# Patient Record
Sex: Female | Born: 1950 | Race: Black or African American | Hispanic: No | Smoking: Never smoker
Health system: Southern US, Community
[De-identification: ages and names within clinical notes are randomized; demographics above are authoritative.]

## PROBLEM LIST (undated history)

## (undated) DIAGNOSIS — A64 Unspecified sexually transmitted disease: Secondary | ICD-10-CM

## (undated) DIAGNOSIS — K8020 Calculus of gallbladder without cholecystitis without obstruction: Secondary | ICD-10-CM

## (undated) DIAGNOSIS — E7800 Pure hypercholesterolemia, unspecified: Secondary | ICD-10-CM

## (undated) DIAGNOSIS — I10 Essential (primary) hypertension: Secondary | ICD-10-CM

## (undated) HISTORY — PX: CHOLECYSTECTOMY: SHX55

## (undated) HISTORY — DX: Pure hypercholesterolemia, unspecified: E78.00

## (undated) HISTORY — DX: Unspecified sexually transmitted disease: A64

## (undated) HISTORY — DX: Calculus of gallbladder without cholecystitis without obstruction: K80.20

## (undated) HISTORY — PX: TONSILLECTOMY: SUR1361

## (undated) HISTORY — DX: Essential (primary) hypertension: I10

---

## 1987-07-09 HISTORY — PX: ABDOMINAL HYSTERECTOMY: SHX81

## 1989-07-08 HISTORY — PX: OOPHORECTOMY: SHX86

## 1998-01-10 ENCOUNTER — Encounter: Admission: RE | Admit: 1998-01-10 | Discharge: 1998-01-10 | Payer: Self-pay | Admitting: *Deleted

## 1998-10-15 ENCOUNTER — Encounter: Payer: Self-pay | Admitting: Emergency Medicine

## 1998-10-15 ENCOUNTER — Emergency Department (HOSPITAL_COMMUNITY): Admission: EM | Admit: 1998-10-15 | Discharge: 1998-10-15 | Payer: Self-pay | Admitting: Emergency Medicine

## 1999-04-10 ENCOUNTER — Other Ambulatory Visit: Admission: RE | Admit: 1999-04-10 | Discharge: 1999-04-10 | Payer: Self-pay | Admitting: Obstetrics and Gynecology

## 2000-01-09 ENCOUNTER — Emergency Department (HOSPITAL_COMMUNITY): Admission: EM | Admit: 2000-01-09 | Discharge: 2000-01-09 | Payer: Self-pay | Admitting: Emergency Medicine

## 2000-11-06 ENCOUNTER — Encounter: Payer: Self-pay | Admitting: Gastroenterology

## 2000-11-06 ENCOUNTER — Encounter: Admission: RE | Admit: 2000-11-06 | Discharge: 2000-11-06 | Payer: Self-pay | Admitting: Gastroenterology

## 2000-11-21 ENCOUNTER — Ambulatory Visit (HOSPITAL_COMMUNITY): Admission: RE | Admit: 2000-11-21 | Discharge: 2000-11-21 | Payer: Self-pay | Admitting: Gastroenterology

## 2000-11-23 ENCOUNTER — Emergency Department (HOSPITAL_COMMUNITY): Admission: EM | Admit: 2000-11-23 | Discharge: 2000-11-23 | Payer: Self-pay | Admitting: Emergency Medicine

## 2002-08-31 ENCOUNTER — Other Ambulatory Visit: Admission: RE | Admit: 2002-08-31 | Discharge: 2002-08-31 | Payer: Self-pay | Admitting: Obstetrics and Gynecology

## 2003-01-11 ENCOUNTER — Emergency Department (HOSPITAL_COMMUNITY): Admission: EM | Admit: 2003-01-11 | Discharge: 2003-01-11 | Payer: Self-pay | Admitting: Emergency Medicine

## 2003-09-09 ENCOUNTER — Ambulatory Visit (HOSPITAL_COMMUNITY): Admission: RE | Admit: 2003-09-09 | Discharge: 2003-09-10 | Payer: Self-pay | Admitting: General Surgery

## 2003-09-09 ENCOUNTER — Encounter (INDEPENDENT_AMBULATORY_CARE_PROVIDER_SITE_OTHER): Payer: Self-pay | Admitting: Specialist

## 2003-12-22 ENCOUNTER — Observation Stay (HOSPITAL_COMMUNITY): Admission: EM | Admit: 2003-12-22 | Discharge: 2003-12-23 | Payer: Self-pay | Admitting: Emergency Medicine

## 2004-01-16 ENCOUNTER — Encounter: Admission: RE | Admit: 2004-01-16 | Discharge: 2004-01-16 | Payer: Self-pay | Admitting: Gastroenterology

## 2004-02-09 ENCOUNTER — Other Ambulatory Visit: Admission: RE | Admit: 2004-02-09 | Discharge: 2004-02-09 | Payer: Self-pay | Admitting: Obstetrics and Gynecology

## 2005-02-11 ENCOUNTER — Other Ambulatory Visit: Admission: RE | Admit: 2005-02-11 | Discharge: 2005-02-11 | Payer: Self-pay | Admitting: Obstetrics and Gynecology

## 2005-06-13 ENCOUNTER — Encounter: Admission: RE | Admit: 2005-06-13 | Discharge: 2005-06-13 | Payer: Self-pay | Admitting: Gastroenterology

## 2006-02-13 ENCOUNTER — Other Ambulatory Visit: Admission: RE | Admit: 2006-02-13 | Discharge: 2006-02-13 | Payer: Self-pay | Admitting: Obstetrics and Gynecology

## 2007-02-16 ENCOUNTER — Other Ambulatory Visit: Admission: RE | Admit: 2007-02-16 | Discharge: 2007-02-16 | Payer: Self-pay | Admitting: Obstetrics and Gynecology

## 2008-08-09 ENCOUNTER — Encounter: Payer: Self-pay | Admitting: Obstetrics and Gynecology

## 2008-08-09 ENCOUNTER — Ambulatory Visit: Payer: Self-pay | Admitting: Obstetrics and Gynecology

## 2008-08-09 ENCOUNTER — Other Ambulatory Visit: Admission: RE | Admit: 2008-08-09 | Discharge: 2008-08-09 | Payer: Self-pay | Admitting: Obstetrics and Gynecology

## 2009-04-17 ENCOUNTER — Ambulatory Visit: Payer: Self-pay | Admitting: Obstetrics and Gynecology

## 2009-09-07 ENCOUNTER — Emergency Department (HOSPITAL_COMMUNITY): Admission: EM | Admit: 2009-09-07 | Discharge: 2009-09-07 | Payer: Self-pay | Admitting: Emergency Medicine

## 2009-11-30 ENCOUNTER — Ambulatory Visit: Payer: Self-pay | Admitting: Obstetrics and Gynecology

## 2009-11-30 ENCOUNTER — Other Ambulatory Visit: Admission: RE | Admit: 2009-11-30 | Discharge: 2009-11-30 | Payer: Self-pay | Admitting: Obstetrics and Gynecology

## 2010-04-19 ENCOUNTER — Emergency Department (HOSPITAL_COMMUNITY): Admission: EM | Admit: 2010-04-19 | Discharge: 2010-04-19 | Payer: Self-pay | Admitting: Emergency Medicine

## 2010-08-29 ENCOUNTER — Ambulatory Visit (INDEPENDENT_AMBULATORY_CARE_PROVIDER_SITE_OTHER): Payer: 59 | Admitting: Obstetrics and Gynecology

## 2010-08-29 DIAGNOSIS — N898 Other specified noninflammatory disorders of vagina: Secondary | ICD-10-CM

## 2010-08-29 DIAGNOSIS — B373 Candidiasis of vulva and vagina: Secondary | ICD-10-CM

## 2010-09-19 LAB — DIFFERENTIAL
Eosinophils Absolute: 0.1 10*3/uL (ref 0.0–0.7)
Lymphocytes Relative: 31 % (ref 12–46)
Lymphs Abs: 1.8 10*3/uL (ref 0.7–4.0)
Monocytes Absolute: 0.5 10*3/uL (ref 0.1–1.0)
Monocytes Relative: 8 % (ref 3–12)

## 2010-09-19 LAB — CBC
HCT: 36.2 % (ref 36.0–46.0)
MCV: 89.2 fL (ref 78.0–100.0)
RDW: 13.9 % (ref 11.5–15.5)

## 2010-09-19 LAB — BASIC METABOLIC PANEL
CO2: 29 mEq/L (ref 19–32)
Creatinine, Ser: 0.79 mg/dL (ref 0.4–1.2)
GFR calc Af Amer: >60 mL/min (ref >60)
GFR calc non Af Amer: >60 mL/min (ref >60)
Sodium: 136 mEq/L (ref 135–145)

## 2010-09-30 LAB — D-DIMER, QUANTITATIVE: D-Dimer, Quant: 0.33 ug/mL-FEU (ref 0.00–0.48)

## 2010-09-30 LAB — POCT CARDIAC MARKERS
CKMB, poc: <1 ng/mL — ABNORMAL LOW (ref 1.0–8.0)
Myoglobin, poc: 51.4 ng/mL (ref 12–200)

## 2010-11-23 NOTE — Op Note (Signed)
Candace Baker, Candace Baker                        ACCOUNT NO.:  [id number]   MEDICAL RECORD NO.:  [id number]                   PATIENT TYPE:  OIB   LOCATION:  2550                                 FACILITY:  [facility]   PHYSICIAN:  Gita Kudo, M.D.              DATE OF BIRTH:  [birth year]   DATE OF PROCEDURE:  09/09/2003  DATE OF DISCHARGE:                                 OPERATIVE REPORT   PREOPERATIVE DIAGNOSIS:  Gallstones.   POSTOPERATIVE DIAGNOSIS:  Gallstones.  Normal cholangiogram   OPERATION PERFORMED:  Laparoscopic cholecystectomy with intraoperative  cholangiogram.   SURGEON:  Gita Kudo, M.D.   ANESTHESIA:  General endotracheal.   INDICATIONS FOR PROCEDURE:  The patient is a 60 year old female with bouts  of abdominal pain, had gallstones noted on ultrasound.  She has normal liver  function studies and comes in for elective cholecystectomy.   OPERATIVE FINDINGS:  The gallbladder was thin-walled, was partially  intrahepatic, cystic duct and artery normal in anatomy and size.  Cholangiogram appeared normal.   DESCRIPTION OF PROCEDURE:  Under satisfactory general endotracheal  anesthesia having received 1.0 g Ancef preop, patient's abdomen prepped and  draped in the standard fashion.  A total of 30 mL of Marcaine with  epinephrine was infiltrated in the incisions before they were made.  A  midline incision made below the umbilicus and carried down through the  peritoneum into the abdomen. Controlled with a figure-of-eight 0 Vicryl  suture.  An operating Hasson port inserted, secured.  Good CO2  pneumoperitoneum established and camera placed.  Under direct vision, two #5  ports placed laterally and a second #10 medially.  Graspers in the lateral  port afforded excellent exposure and operating through the medial port, we  carefully dissected the cystic duct and clipped it close to the gallbladder.  Incision made in the duct and a catheter placed percutaneously to  obtain  good cholangiograms.  After reviewing the films the catheter was withdrawn  and the duct controlled with multiple clips and divided.  The small cystic  artery was identified, controlled with multiple clips and divided.  Then the  large lymphatic pedicle was likewise controlled with clips before dividing.  The gallbladder then removed from below upward off the liver bed using the  coagulating spatula for both hemostasis and dissection.  The liver bed was  lavaged with saline, made hemostatic by cautery and then suctioned dry.   Because a small hole was made in the gallbladder, it was placed in the  EndoCatch bag and retrieved through the umbilical incision after the camera  was moved to the upper port.  There was an adhesion from her previous gyn  surgery of small bowel to the anterior abdominal wall and I removed this  with the scissors, not using cautery, not having any bleeding or bowel  injury.  Then the abdomen was lavaged with saline, suctioned dry and the CO2  and  ports released.  The midline closed with the previous suture and a second  interrupted 0 Vicryl.  All subcu approximated with 4-0 Vicryl and Steri-  Strips approximated the skin.  Sterile absorbent dressings applied and the  patient went to the recovery room from the operating room in good condition  without complication.                                               Gita Kudo, M.D.    MRL/MEDQ  D:  09/09/2003  T:  09/10/2003  Job:  [id number]   cc:   Al Decant. Janey Greaser, MD  [address]  [place]  [place] [address]  Fax: [phone]

## 2010-11-23 NOTE — Discharge Summary (Signed)
NAMEKHRISTEN, [name]                        ACCOUNT NO.:  [id number]   MEDICAL RECORD NO.:  [id number]                   PATIENT TYPE:  INP   LOCATION:  4736                                 FACILITY:  [facility]   PHYSICIAN:  Corinna L. Lendell Caprice, MD             DATE OF BIRTH:  12-Aug-1950   DATE OF ADMISSION:  12/22/2003  DATE OF DISCHARGE:  12/23/2003                                 DISCHARGE SUMMARY   DIAGNOSES:  1. Chest pain, most likely esophageal spasm.  2. Diabetes.  3. Hypertension.  4. Hyperlipidemia.  5. Gastroesophageal reflux disease.  6. Hypertension.   DISCHARGE MEDICATIONS:  Same as upon admission with the addition of Carafate  1 gram p.o. q.a.c. and h.s.   CONSULTATIONS:  [facility].   DISCHARGE CONDITION:  Stable.   DISCHARGE ACTIVITY:  Ad lib.   DISCHARGE DIET:  Diabetic.   FOLLOWUP:  With primary care physician next week as previously scheduled.   LABORATORY DATA:  CBC unremarkable.  CPK-MB and troponin x3 negative.  LDL  was 99, HDL 48, total cholesterol 161, triglycerides 152.  BMET  unremarkable.   SPECIAL STUDIES AND RADIOLOGY:  1. EKG showed normal sinus rhythm with nonspecific T-wave abnormalities.  2. Stress Cardiolite official report is pending, but her cardiology showed     no ischemia.   HISTORY AND HOSPITAL COURSE:  Mr. Candace Baker is a 60 year old black female with  multiple cardiac risk factors who presented to the emergency room with three  days worth of chest pain.  It was atypical in nature.  It tended to be in  the morning, substernal.  She was admitted to telemetry and ruled out for  myocardial infarction.  She had an essentially unremarkable examination and  normal vital signs.  Cardiology did the stress test which is  negative.  I suspect this may be related to reflux and esophageal spasm.  She is already on b.i.d. Nexium 40 mg so I have added Carafate to see  whether this will help.  If it continues, consider doing a repeat EGD.   She  reports that she had an EGD with Dr. Tasia Catchings in 2002 which was  reportedly normal.                                                Corinna L. Lendell Caprice, MD    CLS/MEDQ  D:  12/23/2003  T:  12/26/2003  Job:  [id number]   cc:   Janey Greaser, Dr.  Deboraha Sprang [facility]

## 2010-11-23 NOTE — H&P (Signed)
Candace Baker, Candace Baker                        ACCOUNT NO.:  [id number]   MEDICAL RECORD NO.:  [id number]                   PATIENT TYPE:  INP   LOCATION:  1845                                 FACILITY:  [facility]   PHYSICIAN:  Hollice Espy, M.D.            DATE OF BIRTH:  13-May-1951   DATE OF ADMISSION:  12/22/2003  DATE OF DISCHARGE:                                HISTORY & PHYSICAL   PRIMARY CARE PHYSICIAN:  Dr. [facility].   CHIEF COMPLAINT:  Chest pain.   HISTORY OF PRESENT ILLNESS:  This is a 60 year old African-American female  with past medical history of hyperlipidemia, hypertension, and diabetes  mellitus who presents with chest pain.  The patient states she has been  relatively previously well.  She started having chest pain approximately  four days ago.  She describes it as continuous sense of fullness in her  chest.  She does not really describe it more as pressure or sharp sensation,  more felt like she was being full, although she could not say otherwise.  She denied any radiation, shortness of breath, diaphoresis, or presyncope  feelings.  She went to be evaluated and I believe at her primary care  physician's office she had an EKG done which showed essentially normal sinus  rhythm.  The patient was sent to the emergency room for further evaluation.   Initial set of cardiac enzymes were done and were negative.  Chest x-ray  showed no acute disease.  EKG done here shows again some minor T wave  abnormalities but a question of flipped T waves in lead V3.  There was also  some poor voltage showing.  This was slightly different from the findings in  the previous EKG done earlier today.  The patient's lab work was otherwise  unremarkable.  She currently states she is feeling back to normal and has no  complaints.  Currently she denies any chest pain, palpitations, shortness of  breath, wheezing, or coughing.  She denies any headaches, visual changes,  dysphagia, abdominal pain, hematuria, dysuria, constipation, diarrhea, focal  extremity weakness, fevers, or chills.   PAST MEDICAL HISTORY:  Hypertension and diabetes, both of which she says are  relatively well controlled.  She says she has had a problem with getting her  hyperlipidemia under control.  She has also had a problem with severe GERD  which has not responded to medications.  The patient has a history of  seasonal allergies.   MEDICATIONS:  1. Allegra 60 mg daily.  2. Pravachol 40 daily.  3. Micardis 80 mg daily.  4. Nexium 40 p.o. b.i.d.  5. Hydrochlorothiazide 6.25 p.o. daily.  6. Glucophage 1000 mg p.o. b.i.d.  7. Aspirin 81 p.o. daily.  8. Prempro.   ALLERGIES:  She is allergic to [facility].   FAMILY HISTORY:  She denies any history of CAD.   SOCIAL HISTORY:  She denies any tobacco or drug  use.  She occasionally  drinks alcohol but not heavily.   PHYSICAL EXAMINATION:  VITAL SIGNS:  On admission, temperature 98.1, heart  rate 72, blood pressure 155/79, respirations 20, O2 saturation 100% on room  air.  GENERAL:  Alert and oriented x 3.  No apparent distress.  HEENT:  Normocephalic and atraumatic.  NECK:  No carotid bruits.  HEART:  Regular rate and rhythm.  S1, S2.  LUNGS:  Clear to auscultation bilaterally.  ABDOMEN:  Soft, nontender, nondistended.  Positive bowel sounds.  EXTREMITIES:  No clubbing, cyanosis, or edema.  She has good 2+ pulses.   LABORATORY AND X-RAY DATA:  Lab work:  Her white count was 7.4 with no  shift, hemoglobin 12.2, hematocrit 35.7, MCV 84, platelet count 301.  CPK  and MB are 62 and 0.9, respectively.  Troponin I is 0.01.  Sodium 138,  potassium 3.6, chloride 103, bicarb 26, BUN 9, creatinine 0.9, glucose 94.  The pH was 7.38, PCO2 of 44.  Hemoglobin 12.9, hematocrit 39.   Chest x-ray shows no acute disease.   IMPRESSION AND PLAN:  1. Atypical chest pain.  Will rule out myocardial infarction with history of     diabetes,  hypertension, hyperlipidemia, questionable electrocardiogram     changes  Will observe for 24 hours, check two more sets of cardiac     enzymes, stress test in the morning.  2. Hypertension.  Will continue her on ARB.  3. Hyperlipidemia.  Continue Pravachol.  4. Gastroesophageal reflux disease.  Continue Nexium.  It could be her chest     pain is GERD related.  5. Diabetes mellitus, continue Glucophage.                                                Hollice Espy, M.D.    SKK/MEDQ  D:  12/22/2003  T:  12/22/2003  Job:  [id number]   cc:   Dr. Janey Greaser

## 2010-11-23 NOTE — Consult Note (Signed)
Candace Baker, Candace Baker                        ACCOUNT NO.:  [id number]   MEDICAL RECORD NO.:  [id number]                   PATIENT TYPE:  INP   LOCATION:  4736                                 FACILITY:  [facility]   PHYSICIAN:  Armanda Magic, M.D.                  DATE OF BIRTH:  1950/08/16   DATE OF CONSULTATION:  12/23/2003  DATE OF DISCHARGE:  12/23/2003                                   CONSULTATION   REFERRING PHYSICIAN:  [facility].   CHIEF COMPLAINT:  Chest pain.   This is a very pleasant 60 year old female who was sent to the emergency  room yesterday with complaints of 4-day history of chest pain described as a  fullness that has been waxing and waning.  There is no associated radiation,  shortness of breath, nausea, vomiting, or diaphoresis.  It was not worse  with activity.  She has worked for the last 4 days.  Nexium was recently  increased secondary to worsening symptoms of GERD with burping and belching.  She currently is pain free.   PAST MEDICAL HISTORY:  Significant for:  1. Hypertension.  2. Hyperlipidemia.  3. Non-insulin-dependent diabetes mellitus.  4. Gastroesophageal reflux disease.  5. Seasonal allergies.   OUTPATIENT MEDICATIONS:  1. Allegra 60 mg a day.  2. Micardis 80 mg a day.  3. Hydrochlorothiazide 6.25 mg a day.  4. Aspirin 81 mg a day.  5. Pravachol 40 mg a day.  6. Nexium 40 mg b.i.d.  7. Glucophage 1000 mg b.i.d.  8. Prempro daily.   ALLERGIES:  TIAZAC.   FAMILY HISTORY:  Her father died from being murdered.  Her mother died from  a blood clot from her leg.  She had a history of sarcoid.   SOCIAL HISTORY:  She denies tobacco or drug use.  She does drink occasional  alcohol.  She is married and a mother of two.  She works doing Youth worker.   REVIEW OF SYSTEMS:  Other than what is stated in the HPI is negative.   PHYSICAL EXAMINATION:  VITAL SIGNS:  Blood pressure 132/79, heart rate 74,  respirations 20, she is  afebrile, O2 saturations on room air 100%.  GENERAL:  This is a well-developed, well-nourished female in no acute  distress.  HEENT:  Benign.  NECK:  Supple without lymphadenopathy.  Carotid upstrokes +2 bilaterally, no  bruits.  LUNGS:  Clear to auscultation throughout.  HEART:  Regular rate and rhythm with a 1/6 systolic ejection murmur at the  left lower sternal border, normal S1, S2.  ABDOMEN:  Soft, nontender, nondistended, with active bowel sounds.  No  hepatosplenomegaly.  EXTREMITIES:  No cyanosis, erythema, or edema.  Good distal pulses.  NEUROLOGIC:  She is alert and oriented x3 with no focal deficits.   LABORATORY DATA:  Sodium 138, potassium 3.6, chloride 103, bicarb 27, BUN 9,  creatinine 0.9, glucose 94.  White cell count is 7.4, hemoglobin 12.2,  hematocrit 35.7, platelet count 301.  Fasting lipid panel is pending.  CPKs  are 62 and 37; MB 0.9 and 0.6; troponin 0.01, less than 0.01, and less than  0.01.  EKG shows normal sinus rhythm at 69 with nonspecific ST-T wave  changes.   ASSESSMENT:  1. Atypical chest pain.  Cardiac enzymes thus far are negative.  EKG is     nonischemic.  2. Hypertension, well controlled.  3. Non-insulin-dependent diabetes mellitus.  4. Hypokalemia.  5. Hyperlipidemia.  6. Headache secondary to nitroglycerin paste.   PLAN:  1. Discontinue nitroglycerin paste secondary to headache.  2. Replete potassium.  3. Stress Cardiolite study today to rule out inducible ischemia.                                               Armanda Magic, M.D.    TT/MEDQ  D:  12/23/2003  T:  12/24/2003  Job:  [id number]   cc:   Sadie Haber [facility]

## 2010-12-10 ENCOUNTER — Ambulatory Visit (INDEPENDENT_AMBULATORY_CARE_PROVIDER_SITE_OTHER): Payer: 59 | Admitting: Obstetrics and Gynecology

## 2010-12-10 DIAGNOSIS — N390 Urinary tract infection, site not specified: Secondary | ICD-10-CM

## 2010-12-10 DIAGNOSIS — N898 Other specified noninflammatory disorders of vagina: Secondary | ICD-10-CM

## 2010-12-10 DIAGNOSIS — B373 Candidiasis of vulva and vagina: Secondary | ICD-10-CM

## 2010-12-10 DIAGNOSIS — R82998 Other abnormal findings in urine: Secondary | ICD-10-CM

## 2010-12-18 ENCOUNTER — Other Ambulatory Visit: Payer: Self-pay | Admitting: Obstetrics and Gynecology

## 2010-12-18 ENCOUNTER — Other Ambulatory Visit (HOSPITAL_COMMUNITY)
Admission: RE | Admit: 2010-12-18 | Discharge: 2010-12-18 | Disposition: A | Discharge status: Home / Self Care | Payer: 59 | Source: Ambulatory Visit | Attending: Obstetrics and Gynecology | Admitting: Obstetrics and Gynecology

## 2010-12-18 ENCOUNTER — Encounter (INDEPENDENT_AMBULATORY_CARE_PROVIDER_SITE_OTHER): Payer: 59 | Admitting: Obstetrics and Gynecology

## 2010-12-18 DIAGNOSIS — Z124 Encounter for screening for malignant neoplasm of cervix: Secondary | ICD-10-CM | POA: Insufficient documentation

## 2010-12-18 DIAGNOSIS — Z01419 Encounter for gynecological examination (general) (routine) without abnormal findings: Secondary | ICD-10-CM

## 2010-12-21 ENCOUNTER — Other Ambulatory Visit: Payer: Self-pay | Admitting: Family Medicine

## 2010-12-21 DIAGNOSIS — R1013 Epigastric pain: Secondary | ICD-10-CM

## 2010-12-25 ENCOUNTER — Ambulatory Visit
Admission: RE | Admit: 2010-12-25 | Discharge: 2010-12-25 | Disposition: A | Discharge status: Home / Self Care | Payer: 59 | Source: Ambulatory Visit | Attending: Family Medicine | Admitting: Family Medicine

## 2010-12-25 DIAGNOSIS — R1013 Epigastric pain: Secondary | ICD-10-CM

## 2010-12-25 MED ORDER — IOHEXOL 300 MG/ML  SOLN
100.0000 mL | Freq: Once | INTRAMUSCULAR | Status: AC | PRN
  Administered 2010-12-25: 100 mL via INTRAVENOUS

## 2011-04-23 ENCOUNTER — Other Ambulatory Visit: Payer: Self-pay | Admitting: Obstetrics and Gynecology

## 2011-04-23 NOTE — Telephone Encounter (Signed)
Okay to refill. She can have refills till annual exam due. If she is currently due give her 1 month supply and reminder to return.

## 2011-10-14 ENCOUNTER — Ambulatory Visit: Payer: 59 | Attending: Family Medicine | Admitting: Physical Therapy

## 2011-10-14 DIAGNOSIS — M25569 Pain in unspecified knee: Secondary | ICD-10-CM | POA: Insufficient documentation

## 2011-10-14 DIAGNOSIS — M25559 Pain in unspecified hip: Secondary | ICD-10-CM | POA: Insufficient documentation

## 2011-10-14 DIAGNOSIS — R293 Abnormal posture: Secondary | ICD-10-CM | POA: Insufficient documentation

## 2011-10-14 DIAGNOSIS — IMO0001 Reserved for inherently not codable concepts without codable children: Secondary | ICD-10-CM | POA: Insufficient documentation

## 2011-10-16 ENCOUNTER — Ambulatory Visit: Payer: 59 | Admitting: Physical Therapy

## 2011-10-17 ENCOUNTER — Encounter: Payer: 59 | Admitting: Physical Therapy

## 2011-10-22 ENCOUNTER — Ambulatory Visit: Payer: 59 | Admitting: Rehabilitation

## 2011-10-23 ENCOUNTER — Encounter: Payer: 59 | Admitting: Physical Therapy

## 2011-10-29 ENCOUNTER — Encounter: Payer: 59 | Admitting: Rehabilitation

## 2011-10-29 ENCOUNTER — Ambulatory Visit: Payer: 59 | Admitting: Rehabilitation

## 2011-10-30 ENCOUNTER — Ambulatory Visit: Payer: 59 | Admitting: Physical Therapy

## 2011-11-05 ENCOUNTER — Ambulatory Visit: Payer: 59 | Admitting: Rehabilitation

## 2011-11-06 ENCOUNTER — Ambulatory Visit: Payer: 59 | Attending: Family Medicine | Admitting: Physical Therapy

## 2011-11-06 DIAGNOSIS — IMO0001 Reserved for inherently not codable concepts without codable children: Secondary | ICD-10-CM | POA: Insufficient documentation

## 2011-11-06 DIAGNOSIS — R293 Abnormal posture: Secondary | ICD-10-CM | POA: Insufficient documentation

## 2011-11-06 DIAGNOSIS — M25559 Pain in unspecified hip: Secondary | ICD-10-CM | POA: Insufficient documentation

## 2011-11-06 DIAGNOSIS — M25569 Pain in unspecified knee: Secondary | ICD-10-CM | POA: Insufficient documentation

## 2011-12-05 ENCOUNTER — Other Ambulatory Visit: Payer: Self-pay | Admitting: Obstetrics and Gynecology

## 2011-12-05 ENCOUNTER — Encounter: Payer: Self-pay | Admitting: Obstetrics and Gynecology

## 2011-12-20 ENCOUNTER — Encounter: Payer: 59 | Admitting: Obstetrics and Gynecology

## 2011-12-25 ENCOUNTER — Other Ambulatory Visit: Payer: Self-pay | Admitting: Family Medicine

## 2011-12-25 DIAGNOSIS — M79609 Pain in unspecified limb: Secondary | ICD-10-CM

## 2012-01-03 ENCOUNTER — Ambulatory Visit (INDEPENDENT_AMBULATORY_CARE_PROVIDER_SITE_OTHER): Payer: 59 | Admitting: Obstetrics and Gynecology

## 2012-01-03 ENCOUNTER — Encounter: Payer: Self-pay | Admitting: Obstetrics and Gynecology

## 2012-01-03 VITALS — BP 120/70 | Ht 64.0 in | Wt 152.0 lb

## 2012-01-03 DIAGNOSIS — E119 Type 2 diabetes mellitus without complications: Secondary | ICD-10-CM | POA: Insufficient documentation

## 2012-01-03 DIAGNOSIS — Z01419 Encounter for gynecological examination (general) (routine) without abnormal findings: Secondary | ICD-10-CM

## 2012-01-03 DIAGNOSIS — I10 Essential (primary) hypertension: Secondary | ICD-10-CM | POA: Insufficient documentation

## 2012-01-03 DIAGNOSIS — A64 Unspecified sexually transmitted disease: Secondary | ICD-10-CM | POA: Insufficient documentation

## 2012-01-03 DIAGNOSIS — E7800 Pure hypercholesterolemia, unspecified: Secondary | ICD-10-CM | POA: Insufficient documentation

## 2012-01-03 MED ORDER — ESTRADIOL 1 MG PO TABS: 1.0000 mg | ORAL_TABLET | Freq: Every day | ORAL | Status: DC

## 2012-01-03 MED ORDER — VALACYCLOVIR HCL 1 G PO TABS: ORAL_TABLET | ORAL | Status: DC

## 2012-01-03 NOTE — Progress Notes (Signed)
Patient came to see me today for her annual GYN exam. She continues on oral estradiol for vasomotor symptoms with excellent results. She is having no vaginal bleeding. She is having no pelvic pain. She has always had normal Pap smears. Her last Pap smear was last year. She is status post TAH, BSO done in 2 surgeries in 1989 and 1991 for endometriosis. She has had 2 normal bone densities. The last one was in 2008. She is up-to-date on mammograms. 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 She does her lab work elsewhere.  HEENT: Within normal limits. Kennon Portela present. Neck: No masses. Supraclavicular lymph nodes: Not enlarged. Breasts: Examined in both sitting and lying position. Symmetrical without skin changes or masses. Abdomen: Soft no masses guarding or rebound. No hernias. Pelvic: External within normal limits. BUS within normal limits. Vaginal examination shows good estrogen effect, no cystocele enterocele or rectocele. Cervix and uterus absent. Adnexa within normal limits. Rectovaginal confirmatory. Extremities within normal limits.  Assessment: #1. Vasomotor symptoms #2. HSV  Plan: Continue estradiol. Continue Valtrex. Continue yearly mammograms.The new Pap smear guidelines were discussed with the patient. No pap done.

## 2012-01-04 LAB — URINALYSIS W MICROSCOPIC + REFLEX CULTURE
Bacteria, UA: NONE SEEN
Bilirubin Urine: NEGATIVE
Casts: NONE SEEN
Glucose, UA: NEGATIVE mg/dL
Leukocytes, UA: NEGATIVE
Specific Gravity, Urine: >1.03 — ABNORMAL HIGH (ref 1.005–1.030)
pH: 6 (ref 5.0–8.0)

## 2012-01-05 ENCOUNTER — Ambulatory Visit
Admission: RE | Admit: 2012-01-05 | Discharge: 2012-01-05 | Disposition: A | Discharge status: Home / Self Care | Payer: 59 | Source: Ambulatory Visit | Attending: Family Medicine | Admitting: Family Medicine

## 2012-01-05 DIAGNOSIS — M79609 Pain in unspecified limb: Secondary | ICD-10-CM

## 2012-01-10 ENCOUNTER — Encounter: Payer: Self-pay | Admitting: Obstetrics and Gynecology

## 2012-02-10 ENCOUNTER — Other Ambulatory Visit: Payer: Self-pay | Admitting: Obstetrics and Gynecology

## 2013-02-04 ENCOUNTER — Other Ambulatory Visit: Payer: Self-pay | Admitting: Obstetrics and Gynecology

## 2013-03-23 ENCOUNTER — Other Ambulatory Visit: Payer: Self-pay | Admitting: Obstetrics and Gynecology

## 2013-05-12 ENCOUNTER — Other Ambulatory Visit: Payer: Self-pay | Admitting: Gynecology

## 2013-06-04 ENCOUNTER — Other Ambulatory Visit: Payer: Self-pay | Admitting: Cardiology

## 2013-06-09 ENCOUNTER — Telehealth: Payer: Self-pay

## 2013-06-09 MED ORDER — METOPROLOL SUCCINATE ER 25 MG PO TB24: 25.0000 mg | ORAL_TABLET | Freq: Every day | ORAL | Status: DC

## 2013-06-09 NOTE — Telephone Encounter (Signed)
Lm for pt to call to verify if she is taking metoprolol tartrate and not succinate. Ecw states Succinate.

## 2013-06-09 NOTE — Telephone Encounter (Signed)
New message     On metoprolol 25mg --[facility]---pt thinks someone from here called her to get mgs.   She needs a refill

## 2013-06-09 NOTE — Telephone Encounter (Addendum)
Spoke with pt and she is taking Metoprolol Succ 25 mg daily, which is what Ecw stated. Refilled to pharm.

## 2013-06-14 ENCOUNTER — Other Ambulatory Visit: Payer: Self-pay | Admitting: Cardiology

## 2013-06-14 MED ORDER — METOPROLOL SUCCINATE ER 25 MG PO TB24: 25.0000 mg | ORAL_TABLET | Freq: Every day | ORAL | Status: DC

## 2013-07-15 ENCOUNTER — Encounter: Payer: Self-pay | Admitting: Gynecology

## 2013-07-15 ENCOUNTER — Ambulatory Visit (INDEPENDENT_AMBULATORY_CARE_PROVIDER_SITE_OTHER): Payer: 59 | Admitting: Gynecology

## 2013-07-15 VITALS — BP 136/80 | Ht 63.25 in | Wt 160.4 lb

## 2013-07-15 DIAGNOSIS — Z23 Encounter for immunization: Secondary | ICD-10-CM

## 2013-07-15 DIAGNOSIS — Z01419 Encounter for gynecological examination (general) (routine) without abnormal findings: Secondary | ICD-10-CM

## 2013-07-15 DIAGNOSIS — Z79890 Hormone replacement therapy (postmenopausal): Secondary | ICD-10-CM

## 2013-07-15 MED ORDER — ESTRADIOL 1 MG PO TABS: 1.0000 mg | ORAL_TABLET | Freq: Every day | ORAL | Status: DC

## 2013-07-15 MED ORDER — VALACYCLOVIR HCL 1 G PO TABS: 500.0000 mg | ORAL_TABLET | Freq: Every day | ORAL | Status: DC

## 2013-07-15 NOTE — Addendum Note (Signed)
Addended by: Bertram SavinGONZALEZ-CASTILLO, BLANCA A on: 07/15/2013 04:57 PM   Modules accepted: Orders

## 2013-07-15 NOTE — Patient Instructions (Signed)
Tetanus, Diphtheria (Td) Vaccine What You Need to Know WHY GET VACCINATED? Tetanus  and diphtheria are very serious diseases. They are rare in the [place] today, but people who do become infected often have severe complications. Td vaccine is used to protect adolescents and adults from both of these diseases. Both tetanus and diphtheria are infections caused by bacteria. Diphtheria spreads from person to person through coughing or sneezing. Tetanus-causing bacteria enter the body through cuts, scratches, or wounds. TETANUS (Lockjaw) causes painful muscle tightening and stiffness, usually all over the body.  It can lead to tightening of muscles in the head and neck so you can't open your mouth, swallow, or sometimes even breathe. Tetanus kills about 1 out of every 5 people who are infected. DIPHTHERIA can cause a thick coating to form in the back of the throat.  It can lead to breathing problems, paralysis, heart failure, and death. Before vaccines, the [place] saw as many as 200,000 cases a year of diphtheria and hundreds of cases of tetanus. Since vaccination began, cases of both diseases have dropped by about 99%. TD VACCINE Td vaccine can protect adolescents and adults from tetanus and diphtheria. Td is usually given as a booster dose every 10 years but it can also be given earlier after a severe and dirty wound or burn. Your doctor can give you more information. Td may safely be given at the same time as other vaccines. SOME PEOPLE SHOULD NOT GET THIS VACCINE  If you ever had a life-threatening allergic reaction after a dose of any tetanus or diphtheria containing vaccine, OR if you have a severe allergy to any part of this vaccine, you should not get Td. Tell your doctor if you have any severe allergies.  Talk to your doctor if you:  have epilepsy or another nervous system problem,  had severe pain or swelling after any vaccine containing diphtheria or tetanus,  ever had  Guillain Barr Syndrome (GBS),  aren't feeling well on the day the shot is scheduled. RISKS OF A VACCINE REACTION With a vaccine, like any medicine, there is a chance of side effects. These are usually mild and go away on their own. Serious side effects are also possible, but are very rare. Most people who get Td vaccine do not have any problems with it. Mild Problems  following Td (Did not interfere with activities)  Pain where the shot was given (about 8 people in 10)  Redness or swelling where the shot was given (about 1 person in 3)  Mild fever (about 1 person in 15)  Headache or Tiredness (uncommon) Moderate Problems following Td (Interfered with activities, but did not require medical attention)  Fever over 102 F (38.9 C) (rare) Severe Problems  following Td (Unable to perform usual activities; required medical attention)  Swelling, severe pain, bleeding, or redness in the arm where the shot was given (rare). Problems that could happen after any vaccine:  Brief fainting spells can happen after any medical procedure, including vaccination. Sitting or lying down for about 15 minutes can help prevent fainting, and injuries caused by a fall. Tell your doctor if you feel dizzy, or have vision changes or ringing in the ears.  Severe shoulder pain and reduced range of motion in the arm where a shot was given can happen, very rarely, after a vaccination.  Severe allergic reactions from a vaccine are very rare, estimated at less than 1 in a million doses. If one were to occur, it would   usually be within a few minutes to a few hours after the vaccination. WHAT IF THERE IS A SERIOUS REACTION? What should I look for?  Look for anything that concerns you, such as signs of a severe allergic reaction, very high fever, or behavior changes. Signs of a severe allergic reaction can include hives, swelling of the face and throat, difficulty breathing, a fast heartbeat, dizziness, and  weakness. These would usually start a few minutes to a few hours after the vaccination. What should I do?  If you think it is a severe allergic reaction or other emergency that can't wait, call 911 or get the person to the nearest hospital. Otherwise, call your doctor.  Afterward, the reaction should be reported to the Vaccine Adverse Event Reporting System (VAERS). Your doctor might file this report, or, you can do it yourself through the VAERS website or by calling 1-778 287 4334. VAERS is only for reporting reactions. They do not give medical advice. THE NATIONAL VACCINE INJURY COMPENSATION PROGRAM The National Vaccine Injury Compensation Program (VICP) is a federal program that was created to compensate people who may have been injured by certain vaccines. Persons who believe they may have been injured by a vaccine can learn about the program and about filing a claim by calling 1-(743)437-4542 or visiting the [facility] website. HOW CAN I LEARN MORE?  Ask your doctor.  Contact your local or state health department.  Contact the Centers for Disease Control and Prevention (CDC):  Call 320-033-68891-7168733522 (1-800-CDC-INFO)  Visit CDC's vaccines website CDC Td Vaccine Interim VIS (08/11/12) Document Released: 04/21/2006 Document Revised: 10/19/2012 Document Reviewed: 10/14/2012 [facility] Patient Information 2014 [employer], [place].  Caltrate plus or oscal or citracal take one tablet of either one daily

## 2013-07-15 NOTE — Progress Notes (Signed)
Candace Baker 08/07/1950 [id number]   History:    63 y.o.  for annual gyn exam who has a past history of a total abdominal hysterectomy with bilateral salpingo-oophorectomy secondary to endometriosis. Patient has been on estrogen replacement therapy since that time and is currently taking Estrace 1 mg daily. She also takes Valtrex for suppression of HSV. She has no complaints today. Her primary care physician is Dr. Clarene Duke. Patient with no prior history of abnormal Pap smears. Her last bone density study was normal in 2008 and her last colonoscopy was normal in 2009. She has received her flu vaccine as well as shingles vaccine but not the Tdap.  Past medical history,surgical history, family history and social history were all reviewed and documented in the EPIC chart.  Gynecologic History No LMP recorded. Patient has had a hysterectomy. Contraception: status post hysterectomy Last Pap: 2012. Results were: normal Last mammogram: 2014. Results were: normal  Obstetric History OB History  Gravida Para Term Preterm AB SAB TAB Ectopic Multiple Living  2 2 2       2     # Outcome Date GA Lbr Len/2nd Weight Sex Delivery Anes PTL Lv  2 TRM           1 TRM                ROS: A ROS was performed and pertinent positives and negatives are included in the history.  GENERAL: No fevers or chills. HEENT: No change in vision, no earache, sore throat or sinus congestion. NECK: No pain or stiffness. CARDIOVASCULAR: No chest pain or pressure. No palpitations. PULMONARY: No shortness of breath, cough or wheeze. GASTROINTESTINAL: No abdominal pain, nausea, vomiting or diarrhea, melena or bright red blood per rectum. GENITOURINARY: No urinary frequency, urgency, hesitancy or dysuria. MUSCULOSKELETAL: No joint or muscle pain, no back pain, no recent trauma. DERMATOLOGIC: No rash, no itching, no lesions. ENDOCRINE: No polyuria, polydipsia, no heat or cold intolerance. No recent change in weight.  HEMATOLOGICAL: No anemia or easy bruising or bleeding. NEUROLOGIC: No headache, seizures, numbness, tingling or weakness. PSYCHIATRIC: No depression, no loss of interest in normal activity or change in sleep pattern.     Exam: chaperone present  BP 136/80  Ht 5' 3.25" (1.607 m)  Wt 160 lb 6.4 oz (72.757 kg)  BMI 28.17 kg/m2  Body mass index is 28.17 kg/(m^2).  General appearance : Well developed well nourished female. No acute distress HEENT: Neck supple, trachea midline, no carotid bruits, no thyroidmegaly Lungs: Clear to auscultation, no rhonchi or wheezes, or rib retractions  Heart: Regular rate and rhythm, no murmurs or gallops Breast:Examined in sitting and supine position were symmetrical in appearance, no palpable masses or tenderness,  no skin retraction, no nipple inversion, no nipple discharge, no skin discoloration, no axillary or supraclavicular lymphadenopathy Abdomen: no palpable masses or tenderness, no rebound or guarding Extremities: no edema or skin discoloration or tenderness  Pelvic:  Bartholin, Urethra, Skene Glands: Within normal limits             Vagina: No gross lesions or discharge  Cervix: absent  Uterus  absent  Adnexa  Without masses or tenderness  Anus and perineum  normal   Rectovaginal  normal sphincter tone without palpated masses or tenderness             Hemoccult cards provided     Assessment/Plan:  63 y.o. female for annual exam with history of surgical menopause has been on estrogen  replacement therapy for over 10 years. We discussed the women's health initiative study. We discussed the risks benefits and pros and cons of long-term use of estrogen replacement therapy. We're going to begin to taper her down by having her break in half the one milligram tablet which would be equivalent to 0.5 mg daily so that next year we can eventually take her off altogether. We did discuss vaginal estrogen in the event in the future with issues of vaginal  dryness and irritation or dyspareunia. Hemoccult cards were provided for her to submit to the office for testing. She will schedule her bone density study. Patient did receive the Tdap vaccine today. Pap smear not done today in accordance to the new guidelines.  Note: This dictation was prepared with  Dragon/digital dictation along withSmart phrase technology. Any transcriptional errors that result from this process are unintentional.   Ok EdwardsFERNANDEZ,JUAN H MD, 4:22 PM 07/15/2013

## 2013-07-19 ENCOUNTER — Encounter: Payer: Self-pay | Admitting: Obstetrics and Gynecology

## 2013-07-22 ENCOUNTER — Encounter: Payer: Self-pay | Admitting: Obstetrics and Gynecology

## 2013-08-02 ENCOUNTER — Ambulatory Visit (INDEPENDENT_AMBULATORY_CARE_PROVIDER_SITE_OTHER): Payer: 59

## 2013-08-02 ENCOUNTER — Other Ambulatory Visit: Payer: Self-pay | Admitting: Gynecology

## 2013-08-02 DIAGNOSIS — Z13820 Encounter for screening for osteoporosis: Secondary | ICD-10-CM

## 2013-08-02 DIAGNOSIS — Z79890 Hormone replacement therapy (postmenopausal): Secondary | ICD-10-CM

## 2013-08-13 ENCOUNTER — Other Ambulatory Visit: Payer: 59 | Admitting: Anesthesiology

## 2013-08-13 DIAGNOSIS — Z1211 Encounter for screening for malignant neoplasm of colon: Secondary | ICD-10-CM

## 2013-09-15 ENCOUNTER — Other Ambulatory Visit: Payer: Self-pay | Admitting: Family Medicine

## 2013-09-15 DIAGNOSIS — M79629 Pain in unspecified upper arm: Secondary | ICD-10-CM

## 2013-09-16 ENCOUNTER — Ambulatory Visit
Admission: RE | Admit: 2013-09-16 | Discharge: 2013-09-16 | Disposition: A | Discharge status: Home / Self Care | Payer: 59 | Source: Ambulatory Visit | Attending: Family Medicine | Admitting: Family Medicine

## 2013-09-16 DIAGNOSIS — M79629 Pain in unspecified upper arm: Secondary | ICD-10-CM

## 2014-02-14 ENCOUNTER — Telehealth: Payer: Self-pay | Admitting: *Deleted

## 2014-02-14 NOTE — Telephone Encounter (Signed)
PA filled out and faxed back to [facility] for estradiol 1 mg, will wait for response.

## 2014-02-16 NOTE — Telephone Encounter (Signed)
Estradiol 1 mg approved for 12 months through 02/15/15

## 2014-02-18 ENCOUNTER — Encounter: Payer: Self-pay | Admitting: Cardiology

## 2014-02-18 ENCOUNTER — Ambulatory Visit (INDEPENDENT_AMBULATORY_CARE_PROVIDER_SITE_OTHER): Payer: Medicare Other | Admitting: Cardiology

## 2014-02-18 VITALS — BP 130/74 | HR 69 | Ht 64.0 in | Wt 159.0 lb

## 2014-02-18 DIAGNOSIS — E7800 Pure hypercholesterolemia, unspecified: Secondary | ICD-10-CM

## 2014-02-18 DIAGNOSIS — I10 Essential (primary) hypertension: Secondary | ICD-10-CM

## 2014-02-18 DIAGNOSIS — I471 Supraventricular tachycardia: Secondary | ICD-10-CM

## 2014-02-18 NOTE — Progress Notes (Signed)
[address], [address] [place], [place]  [address] Phone: [phone] Fax:  [phone]  Date:  02/18/2014   ID:  Candace Baker, Candace Baker 1951-04-04, MRN [id number]  PCP:  Candace Hillier, MD   History of Present Illness: Candace Baker is a 63 y.o. female with diabetes, hypertension, hyperlipidemia, nuclear stress test in March of 2011 with normal EF but brief nonsustained wide-complex tachycardia question SVT with aberrancy here for followup.Since being on the metoprolol she's been doing very well, no significant palpitations, no syncope, no shortness of breath, no chest pain. She states that since taking the metoprolol she has not felt any fluttering sensation.  In regards to hyperlipidemia, excellent control by Dr. Clarene Duke. lab work reviewed. Blood pressure also has been excellently controlled..     Wt Readings from Last 3 Encounters:  02/18/14 159 lb (72.122 kg)  07/15/13 160 lb 6.4 oz (72.757 kg)  01/03/12 152 lb (68.947 kg)     Past Medical History  Diagnosis Date  . Endometriosis   . Gallstones   . STD (sexually transmitted disease)     HSV  . Elevated cholesterol   . Hypertension   . Diabetes mellitus     Past Surgical History  Procedure Laterality Date  . Abdominal hysterectomy  1989    TAH   . Oophorectomy  1991    BSO  . Cholecystectomy    . Tonsillectomy      Current Outpatient Prescriptions  Medication Sig Dispense Refill  . aspirin 81 MG tablet Take 81 mg by mouth.        Marland Kitchen atorvastatin (LIPITOR) 20 MG tablet Take 20 mg by mouth daily.        Marland Kitchen estradiol (ESTRACE) 1 MG tablet Take 1 tablet (1 mg total) by mouth daily.  90 tablet  4  . glimepiride (AMARYL) 1 MG tablet Take 1 mg by mouth daily with breakfast.      . HYDROCHLOROTHIAZIDE PO Take by mouth.      . Lansoprazole (PREVACID PO) Take by mouth.        . metFORMIN (GLUMETZA) 1000 MG (MOD) 24 hr tablet Take 1,000 mg by mouth 2 (two) times daily with a meal.        . metoprolol  succinate (TOPROL XL) 25 MG 24 hr tablet Take 1 tablet (25 mg total) by mouth daily.  90 tablet  3  . telmisartan (MICARDIS) 80 MG tablet Take 80 mg by mouth.        . valACYclovir (VALTREX) 1000 MG tablet Take 0.5 tablets (500 mg total) by mouth daily.  30 tablet  11   No current facility-administered medications for this visit.    Allergies:    Allergies  Allergen Reactions  . Tiazac [Diltiazem Hcl]     Social History:  The patient  reports that she has never smoked. She does not have any smokeless tobacco history on file. She reports that she drinks alcohol. She reports that she does not use illicit drugs.   Family History  Problem Relation Age of Onset  . Sarcoidosis Mother   . Hypertension Maternal Grandfather   . Heart disease Paternal Grandfather     NOT INDICATED IF PATERNAL OR MATERNA GF    ROS:  Please see the history of present illness.   Denies any fevers, chills, orthopnea, PND   All other systems reviewed and negative.   PHYSICAL EXAM: VS:  BP 130/74  Pulse 69  Ht  5\' 4"  (1.626 m)  Wt 159 lb (72.122 kg)  BMI 27.28 kg/m2 Well nourished, well developed, in no acute distress HEENT: normal, [place]/AT, EOMI Neck: no JVD, normal carotid upstroke, no bruit Cardiac:  normal S1, S2; RRR; no murmur Lungs:  clear to auscultation bilaterally, no wheezing, rhonchi or rales Abd: soft, nontender, no hepatomegaly, no bruits Ext: no edema, 2+ distal pulses Skin: warm and dry GU: deferred Neuro: no focal abnormalities noted, AAO x 3  EKG:  02/18/14-sinus rhythm, 76, no other abnormalities  ASSESSMENT AND PLAN:  1. PSVT-metoprolol. Doing well. No significant palpitations. 2. Hyperlipidemia--- doing well. Prior lab work reassuring. 3. Hypertension-currently well controlled. Medications reviewed 4. Diabetes-metformin. Dr. Clarene Baker  Signed, Candace SchultzMark Skains, MD [facility]  02/18/2014 4:19 PM

## 2014-02-18 NOTE — Patient Instructions (Signed)
The current medical regimen is effective;  continue present plan and medications.  Follow up in 1 year with Dr Skains.  You will receive a letter in the mail 2 months before you are due.  Please call us when you receive this letter to schedule your follow up appointment.  

## 2014-02-28 ENCOUNTER — Encounter: Payer: Self-pay | Admitting: Gynecology

## 2014-05-09 ENCOUNTER — Encounter: Payer: Self-pay | Admitting: Cardiology

## 2014-06-10 ENCOUNTER — Other Ambulatory Visit: Payer: Self-pay | Admitting: *Deleted

## 2014-06-10 MED ORDER — METOPROLOL SUCCINATE ER 25 MG PO TB24: 25.0000 mg | ORAL_TABLET | Freq: Every day | ORAL | Status: DC

## 2014-07-07 ENCOUNTER — Encounter: Payer: Self-pay | Admitting: Women's Health

## 2014-07-07 ENCOUNTER — Ambulatory Visit (INDEPENDENT_AMBULATORY_CARE_PROVIDER_SITE_OTHER): Payer: Medicare Other | Admitting: Women's Health

## 2014-07-07 DIAGNOSIS — B9689 Other specified bacterial agents as the cause of diseases classified elsewhere: Secondary | ICD-10-CM

## 2014-07-07 DIAGNOSIS — N760 Acute vaginitis: Secondary | ICD-10-CM

## 2014-07-07 DIAGNOSIS — B3731 Acute candidiasis of vulva and vagina: Secondary | ICD-10-CM

## 2014-07-07 DIAGNOSIS — B373 Candidiasis of vulva and vagina: Secondary | ICD-10-CM

## 2014-07-07 DIAGNOSIS — A499 Bacterial infection, unspecified: Secondary | ICD-10-CM

## 2014-07-07 LAB — WET PREP FOR TRICH, YEAST, CLUE: TRICH WET PREP: NONE SEEN

## 2014-07-07 MED ORDER — METRONIDAZOLE 0.75 % VA GEL: VAGINAL | Status: DC

## 2014-07-07 MED ORDER — FLUCONAZOLE 150 MG PO TABS: 150.0000 mg | ORAL_TABLET | Freq: Once | ORAL | Status: DC

## 2014-07-07 NOTE — Patient Instructions (Signed)
Bacterial Vaginosis Bacterial vaginosis is an infection of the vagina. It happens when too many of certain germs (bacteria) grow in the vagina. HOME CARE  Take your medicine as told by your doctor.  Finish your medicine even if you start to feel better.  Do not have sex until you finish your medicine and are better.  Tell your sex partner that you have an infection. They should see their doctor for treatment.  Practice safe sex. Use condoms. Have only one sex partner. GET HELP IF:  You are not getting better after 3 days of treatment.  You have more grey fluid (discharge) coming from your vagina than before.  You have more pain than before.  You have a fever. MAKE SURE YOU:   Understand these instructions.  Will watch your condition.  Will get help right away if you are not doing well or get worse. Document Released: 04/02/2008 Document Revised: 04/14/2013 Document Reviewed: 02/03/2013 Exit[employer] Patient Information 2015 [employer]. This information is not intended to replace advice given to you by your health care provider. Make sure you discuss any questions you have with your health care provider.  

## 2014-07-07 NOTE — Progress Notes (Signed)
Patient ID: Candace Baker, female   DOB: January 07, 1951, 63 y.o.   MRN: [id number] Presents with complaint of vaginal discharge with irritation, questionable odor, minimal itching. Used over-the-counter Monistat one dose without relief. On Estrace 1 mg daily. TAH with BSO for endometriosis. Same partner. Denies urinary symptoms, abdominal pain or fever.  Exam: Appears well. External genitalia within normal limits, speculum exam scant clear discharge vaginal walls erythematous, wet prep positive for yeast, amines, clues, TNTC bacteria.  Bacteria vaginosis Yeast  Plan: MetroGel vaginal cream 1 applicator at bedtime 5 alcohol precautions reviewed. Diflucan 150 by mouth 1 dose. Instructed to call if no relief of discharge.

## 2014-09-01 ENCOUNTER — Encounter (HOSPITAL_COMMUNITY): Payer: Self-pay

## 2014-09-01 ENCOUNTER — Emergency Department (HOSPITAL_COMMUNITY): Payer: Medicare Other

## 2014-09-01 ENCOUNTER — Emergency Department (HOSPITAL_COMMUNITY)
Admission: EM | Admit: 2014-09-01 | Discharge: 2014-09-01 | Disposition: A | Discharge status: Home / Self Care | Payer: Medicare Other | Attending: Emergency Medicine | Admitting: Emergency Medicine

## 2014-09-01 DIAGNOSIS — E119 Type 2 diabetes mellitus without complications: Secondary | ICD-10-CM | POA: Diagnosis not present

## 2014-09-01 DIAGNOSIS — Z8742 Personal history of other diseases of the female genital tract: Secondary | ICD-10-CM | POA: Diagnosis not present

## 2014-09-01 DIAGNOSIS — Z8719 Personal history of other diseases of the digestive system: Secondary | ICD-10-CM | POA: Diagnosis not present

## 2014-09-01 DIAGNOSIS — Z7982 Long term (current) use of aspirin: Secondary | ICD-10-CM | POA: Diagnosis not present

## 2014-09-01 DIAGNOSIS — Z79899 Other long term (current) drug therapy: Secondary | ICD-10-CM | POA: Insufficient documentation

## 2014-09-01 DIAGNOSIS — Z8619 Personal history of other infectious and parasitic diseases: Secondary | ICD-10-CM | POA: Insufficient documentation

## 2014-09-01 DIAGNOSIS — R079 Chest pain, unspecified: Secondary | ICD-10-CM | POA: Diagnosis not present

## 2014-09-01 DIAGNOSIS — I10 Essential (primary) hypertension: Secondary | ICD-10-CM | POA: Insufficient documentation

## 2014-09-01 LAB — BASIC METABOLIC PANEL
ANION GAP: 11 (ref 5–15)
BUN: 13 mg/dL (ref 6–23)
CALCIUM: 9.1 mg/dL (ref 8.4–10.5)
CO2: 27 mmol/L (ref 19–32)
Chloride: 98 mmol/L (ref 96–112)
Creatinine, Ser: 0.95 mg/dL (ref 0.50–1.10)
GFR calc Af Amer: 71 mL/min — ABNORMAL LOW (ref >90)
GFR, EST NON AFRICAN AMERICAN: 62 mL/min — AB (ref >90)
Glucose, Bld: 160 mg/dL — ABNORMAL HIGH (ref 70–99)
Potassium: 3.6 mmol/L (ref 3.5–5.1)
SODIUM: 136 mmol/L (ref 135–145)

## 2014-09-01 LAB — CBC
HCT: 36.7 % (ref 36.0–46.0)
Hemoglobin: 12.1 g/dL (ref 12.0–15.0)
MCH: 28.9 pg (ref 26.0–34.0)
MCHC: 33 g/dL (ref 30.0–36.0)
MCV: 87.6 fL (ref 78.0–100.0)
PLATELETS: 272 10*3/uL (ref 150–400)
RBC: 4.19 MIL/uL (ref 3.87–5.11)
RDW: 13.2 % (ref 11.5–15.5)
WBC: 8.5 10*3/uL (ref 4.0–10.5)

## 2014-09-01 LAB — I-STAT TROPONIN, ED
TROPONIN I, POC: 0 ng/mL (ref 0.00–0.08)
Troponin i, poc: 0 ng/mL (ref 0.00–0.08)

## 2014-09-01 NOTE — Discharge Instructions (Signed)

## 2014-09-01 NOTE — ED Notes (Signed)
Pt reports left-sided chest pain that started today that lasted about 3 hours.  Pt concerned about a PE; mother died of a PE.  Pt denies any shortness of breath.

## 2014-09-01 NOTE — ED Notes (Signed)
Pt. Refused wheelchair and left with all belongings 

## 2014-09-01 NOTE — ED Provider Notes (Signed)
CSN: [id number]     Arrival date & time 09/01/14  1841 History   First MD Initiated Contact with Patient 09/01/14 2130     Chief Complaint  Patient presents with  . Chest Pain     (Consider location/radiation/quality/duration/timing/severity/associated sxs/prior Treatment) HPI Comments:  Patient with past medical history of hypertension, diabetes, and high cholesterol presents to the emergency department with chief complaint of left-sided chest pain. She states that she had a brief episode of chest pain today that radiated to her left arm. She denies associated shortness of breath , diaphoresis, nausea, vomiting, diarrhea, constipation. She states that she has had a cough and some chest congestion. She has not taken anything to alleviate her symptoms. She states that she is concerned because her mother died of a pulmonary embolus. Patient has no history of ACS or PE or DVT. She denies any lower extremity swelling. Denies any recent travel or surgery. However, she does take estradiol.  The history is provided by the patient. No language interpreter was used.    Past Medical History  Diagnosis Date  . Endometriosis   . Gallstones   . STD (sexually transmitted disease)     HSV  . Elevated cholesterol   . Hypertension   . Diabetes mellitus    Past Surgical History  Procedure Laterality Date  . Abdominal hysterectomy  1989    TAH   . Oophorectomy  1991    BSO  . Cholecystectomy    . Tonsillectomy     Family History  Problem Relation Age of Onset  . Sarcoidosis Mother   . Hypertension Maternal Grandfather   . Heart disease Paternal Grandfather     NOT INDICATED IF PATERNAL OR MATERNA GF   History  Substance Use Topics  . Smoking status: Never Smoker   . Smokeless tobacco: Not on file  . Alcohol Use: Yes     Comment: Rare   OB History    Gravida Para Term Preterm AB TAB SAB Ectopic Multiple Living   2 2 2       2      Review of Systems  Constitutional: Negative for fever  and chills.  Respiratory: Negative for shortness of breath.   Cardiovascular: Positive for chest pain.  Gastrointestinal: Negative for nausea, vomiting, diarrhea and constipation.  Genitourinary: Negative for dysuria.  All other systems reviewed and are negative.     Allergies  Tiazac  Home Medications   Prior to Admission medications   Medication Sig Start Date End Date Taking? Authorizing Provider  aspirin 81 MG tablet Take 81 mg by mouth.      Historical Provider, MD  atorvastatin (LIPITOR) 20 MG tablet Take 20 mg by mouth daily.      Historical Provider, MD  estradiol (ESTRACE) 1 MG tablet Take 1 tablet (1 mg total) by mouth daily. 07/15/13   Ok EdwardsJuan H Fernandez, MD  fluconazole (DIFLUCAN) 150 MG tablet Take 1 tablet (150 mg total) by mouth once. 07/07/14   Harrington ChallengerNancy J Young, NP  glimepiride (AMARYL) 1 MG tablet Take 1 mg by mouth daily with breakfast.    Historical Provider, MD  HYDROCHLOROTHIAZIDE PO Take by mouth.    Historical Provider, MD  Lansoprazole (PREVACID PO) Take by mouth.      Historical Provider, MD  metFORMIN (GLUMETZA) 1000 MG (MOD) 24 hr tablet Take 1,000 mg by mouth 2 (two) times daily with a meal.      Historical Provider, MD  metoprolol succinate (TOPROL XL) 25 MG  24 hr tablet Take 1 tablet (25 mg total) by mouth daily. 06/10/14   Donato Schultz, MD  metroNIDAZOLE (METROGEL VAGINAL) 0.75 % vaginal gel 1 applicator per vagina at HS x 5 07/07/14   Harrington Challenger, NP  telmisartan (MICARDIS) 80 MG tablet Take 80 mg by mouth.      Historical Provider, MD  valACYclovir (VALTREX) 1000 MG tablet Take 0.5 tablets (500 mg total) by mouth daily. 07/15/13   Ok Edwards, MD   BP 181/75 mmHg  Pulse 60  Temp(Src) 97.9 F (36.6 C) (Oral)  Resp 16  SpO2 98% Physical Exam  Constitutional: She is oriented to person, place, and time. She appears well-developed and well-nourished.  HENT:  Head: Normocephalic and atraumatic.  Eyes: Conjunctivae and EOM are normal. Pupils are equal,  round, and reactive to light.  Neck: Normal range of motion. Neck supple.  Cardiovascular: Normal rate and regular rhythm.  Exam reveals no gallop and no friction rub.   No murmur heard. Pulmonary/Chest: Effort normal and breath sounds normal. No respiratory distress. She has no wheezes. She has no rales. She exhibits no tenderness.  Abdominal: Soft. Bowel sounds are normal. She exhibits no distension and no mass. There is no tenderness. There is no rebound and no guarding.  Musculoskeletal: Normal range of motion. She exhibits no edema or tenderness.  Neurological: She is alert and oriented to person, place, and time.  Skin: Skin is warm and dry.  Psychiatric: She has a normal mood and affect. Her behavior is normal. Judgment and thought content normal.  Nursing note and vitals reviewed.   ED Course  Procedures (including critical care time) Labs Review Labs Reviewed  BASIC METABOLIC PANEL - Abnormal; Notable for the following:    Glucose, Bld 160 (*)    GFR calc non Af Amer 62 (*)    GFR calc Af Amer 71 (*)    All other components within normal limits  CBC  I-STAT TROPOININ, ED    Imaging Review Dg Chest 2 View  09/01/2014   CLINICAL DATA:  Left-sided chest pain beginning today and lasting about 3 hr. Cough and congestion. History of hypertension and diabetes. Nonsmoker.  EXAM: CHEST  2 VIEW  COMPARISON:  09/07/2009  FINDINGS: The heart size and mediastinal contours are within normal limits. Both lungs are clear. The visualized skeletal structures are unremarkable. Surgical clips in the right upper quadrant.  IMPRESSION: No active cardiopulmonary disease.   Electronically Signed   By: Burman Nieves M.D.   On: 09/01/2014 20:06     EKG Interpretation   Date/Time:  Thursday September 01 2014 19:09:24 EST Ventricular Rate:  59 PR Interval:  178 QRS Duration: 72 QT Interval:  420 QTC Calculation: 415 R Axis:   17 Text Interpretation:  Sinus bradycardia Cannot rule out Anterior  infarct ,  age undetermined Abnormal ECG No signifcant change from prior Confirmed by  [facility]  MD, BLAIR ([id number]) on 09/01/2014 10:05:11 PM      MDM   Final diagnoses:  Chest pain, unspecified chest pain type     patient with chest pain. She states that she has had pain that his last about 3 hours. She denies any shortness of breath radiating symptoms. She is concerned about PE. She is not tachycardic nor hypoxic. Wells PE criteria is 0. She has had no recent travel or surgeries. No unilateral leg swelling.  Heart score is 3.  Patient does not have any pain now.  Patient seen by and  discussed with Dr. Gwendolyn Grant, who agrees with plan for discharge to home.     Roxy Horseman, PA-C 09/01/14 1610  Elwin Mocha, MD 09/01/14 [phone]

## 2015-02-15 ENCOUNTER — Other Ambulatory Visit: Payer: Self-pay | Admitting: Gynecology

## 2015-02-19 ENCOUNTER — Other Ambulatory Visit: Payer: Self-pay | Admitting: Gynecology

## 2015-06-24 ENCOUNTER — Other Ambulatory Visit: Payer: Self-pay | Admitting: Cardiology

## 2015-08-25 ENCOUNTER — Ambulatory Visit (INDEPENDENT_AMBULATORY_CARE_PROVIDER_SITE_OTHER): Payer: Medicare Other | Admitting: Gynecology

## 2015-08-25 ENCOUNTER — Encounter: Payer: Self-pay | Admitting: Gynecology

## 2015-08-25 VITALS — BP 132/88 | Ht 64.0 in | Wt 155.0 lb

## 2015-08-25 DIAGNOSIS — N951 Menopausal and female climacteric states: Secondary | ICD-10-CM | POA: Diagnosis not present

## 2015-08-25 DIAGNOSIS — Z01419 Encounter for gynecological examination (general) (routine) without abnormal findings: Secondary | ICD-10-CM | POA: Diagnosis not present

## 2015-08-25 DIAGNOSIS — Z79890 Hormone replacement therapy (postmenopausal): Secondary | ICD-10-CM | POA: Diagnosis not present

## 2015-08-25 MED ORDER — VALACYCLOVIR HCL 1 G PO TABS: 500.0000 mg | ORAL_TABLET | Freq: Every day | ORAL | Status: DC

## 2015-08-25 MED ORDER — CLINDAMYCIN PHOSPHATE 2 % VA CREA: 1.0000 | TOPICAL_CREAM | Freq: Every day | VAGINAL | Status: DC

## 2015-08-25 MED ORDER — ESTRADIOL 1 MG PO TABS: 1.0000 mg | ORAL_TABLET | Freq: Every day | ORAL | Status: DC

## 2015-08-25 NOTE — Progress Notes (Signed)
Candace Baker Apr 15, 1951 [id number]   History:    65 y.o.  for annual gyn exam with worsening hot flashes since she had ran out of her Estrace. She takes Estrace 1 mg by mouth daily. Patient with past history of total abdominal hysterectomy with bilateral subbing oophorectomy secondary to endometriosis.She also takes Valtrex for suppression of HSV. She has no complaints today. Her primary care physician is Dr. Clarene Duke. Patient with no prior history of abnormal Pap smears. Her last bone density study was normal in 2015.  Past medical history,surgical history, family history and social history were all reviewed and documented in the EPIC chart.  Gynecologic History No LMP recorded. Patient has had a hysterectomy. Contraception: status post hysterectomy Last Pap: 2012. Results were: normal Last mammogram: 2016. Results were: normal  Obstetric History OB History  Gravida Para Term Preterm AB SAB TAB Ectopic Multiple Living  # Outcome Date GA Lbr Len/2nd Weight Sex Delivery Anes PTL Lv  2 Term           1 Term                ROS: A ROS was performed and pertinent positives and negatives are included in the history.  GENERAL: No fevers or chills. HEENT: No change in vision, no earache, sore throat or sinus congestion. NECK: No pain or stiffness. CARDIOVASCULAR: No chest pain or pressure. No palpitations. PULMONARY: No shortness of breath, cough or wheeze. GASTROINTESTINAL: No abdominal pain, nausea, vomiting or diarrhea, melena or bright red blood per rectum. GENITOURINARY: No urinary frequency, urgency, hesitancy or dysuria. MUSCULOSKELETAL: No joint or muscle pain, no back pain, no recent trauma. DERMATOLOGIC: No rash, no itching, no lesions. ENDOCRINE: No polyuria, polydipsia, no heat or cold intolerance. No recent change in weight. HEMATOLOGICAL: No anemia or easy bruising or bleeding. NEUROLOGIC: No headache, seizures, numbness, tingling or weakness. PSYCHIATRIC: No  depression, no loss of interest in normal activity or change in sleep pattern.     Exam: chaperone present  BP 132/88 mmHg  Ht  (1.626 m)  Wt 155 lb (70.308 kg)  BMI 26.59 kg/m2  Body mass index is 26.59 kg/(m^2).  General appearance : Well developed well nourished female. No acute distress HEENT: Eyes: no retinal hemorrhage or exudates,  Neck supple, trachea midline, no carotid bruits, no thyroidmegaly Lungs: Clear to auscultation, no rhonchi or wheezes, or rib retractions  Heart: Regular rate and rhythm, no murmurs or gallops Breast:Examined in sitting and supine position were symmetrical in appearance, no palpable masses or tenderness,  no skin retraction, no nipple inversion, no nipple discharge, no skin discoloration, no axillary or supraclavicular lymphadenopathy Abdomen: no palpable masses or tenderness, no rebound or guarding Extremities: no edema or skin discoloration or tenderness  Pelvic:  Bartholin, Urethra, Skene Glands: Within normal limits             Vagina: No gross lesions or discharge  Cervix: Absent  Uterus  absent  Adnexa  Without masses or tenderness  Anus and perineum  normal   Rectovaginal  normal sphincter tone without palpated masses or tenderness             Hemoccult PCP provides     Assessment/Plan:  65 y.o. female for annual exam no discharge was noted but because her vaginal odor and going to call her and Cleocin vaginal cream to apply daily at bedtime for 5-7 nights.  Prescription refill for Estrace 1 mg by mouth daily was prescribed. I have recommended that she begin taking half a tablet daily so that we can taper her off by next year. She will schedule her bone density study. Her PCP we'll be doing her blood work. We'll remind her that the and the only she has her mammogram to request a three-dimensional mammogram because of her dense breasts.   Ok Edwards MD, 11:30 AM 08/25/2015

## 2015-08-25 NOTE — Patient Instructions (Signed)

## 2015-09-02 ENCOUNTER — Other Ambulatory Visit: Payer: Self-pay | Admitting: Cardiology

## 2015-10-12 ENCOUNTER — Ambulatory Visit: Payer: Medicare Other | Admitting: *Deleted

## 2015-11-02 ENCOUNTER — Other Ambulatory Visit: Payer: Self-pay | Admitting: Cardiology

## 2015-11-08 ENCOUNTER — Encounter: Payer: Self-pay | Admitting: *Deleted

## 2015-11-08 ENCOUNTER — Encounter: Payer: Medicare Other | Attending: Family Medicine | Admitting: *Deleted

## 2015-11-08 VITALS — Ht 64.0 in | Wt 156.2 lb

## 2015-11-08 DIAGNOSIS — E119 Type 2 diabetes mellitus without complications: Secondary | ICD-10-CM | POA: Diagnosis present

## 2015-11-08 DIAGNOSIS — IMO0002 Reserved for concepts with insufficient information to code with codable children: Secondary | ICD-10-CM

## 2015-11-08 DIAGNOSIS — E118 Type 2 diabetes mellitus with unspecified complications: Secondary | ICD-10-CM

## 2015-11-08 DIAGNOSIS — E1165 Type 2 diabetes mellitus with hyperglycemia: Secondary | ICD-10-CM

## 2015-11-08 NOTE — Progress Notes (Signed)
Diabetes Self-Management Education  Visit Type: First/Initial  Appt. Start Time: 0945 Appt. End Time: 1115  11/08/2015  Ms. Candace Baker, identified by name and date of birth, is a 65 y.o. female with a diagnosis of Diabetes: Type 2.   ASSESSMENT  Height  (1.626 m), weight 156 lb 3.2 oz (70.852 kg). Body mass index is 26.8 kg/(m^2).      Diabetes Self-Management Education - 11/08/15 0944    Visit Information   Visit Type First/Initial   Initial Visit   Diabetes Type Type 2   Are you currently following a meal plan? No   Are you taking your medications as prescribed? Yes   Date Diagnosed 1995   Health Coping   How would you rate your overall health? Good   Psychosocial Assessment   Patient Belief/Attitude about Diabetes Motivated to manage diabetes   Self-care barriers None   Self-management support None   Other persons present Patient   Patient Concerns Glycemic Control   Special Needs None   Preferred Learning Style Hands on   Learning Readiness Ready   What is the last grade level you completed in school? 12th   Pre-Education Assessment   Patient understands the diabetes disease and treatment process. Needs Review   Patient understands incorporating nutritional management into lifestyle. Needs Review   Patient undertands incorporating physical activity into lifestyle. Needs Review   Patient understands using medications safely. Needs Review   Patient understands monitoring blood glucose, interpreting and using results Needs Instruction   Patient understands prevention, detection, and treatment of acute complications. Needs Review   Patient understands how to develop strategies to promote health/change behavior. Needs Review   Complications   Last HgB A1C per patient/outside source 9.2 %   How often do you check your blood sugar? 0 times/day (not testing)   Number of hypoglycemic episodes per month 0   Have you had a dilated eye exam in the past 12 months? Yes    Have you had a dental exam in the past 12 months? Yes   Are you checking your feet? Yes   How many days per week are you checking your feet? 2   Dietary Intake   Breakfast since retirement, may have some toast, fresh fruit OR skips 4 days a week   Snack (morning) fresh fruit occasionally   Lunch sandwich with chips OR salad with chicken added   Snack (afternoon) fruit or PNB crackers occasionally   Dinner eats out often; pizza, seafood, [place] etc   Snack (evening) no   Beverage(s) water, crangrape juice, sweet tea   Exercise   Exercise Type Light (walking / raking leaves)   How many days per week to you exercise? 2   How many minutes per day do you exercise? 60   Total minutes per week of exercise 120   Patient Education   Previous Diabetes Education Yes (please comment)  when first diagnosed   Disease state  Definition of diabetes, type 1 and 2, and the diagnosis of diabetes   Nutrition management  Role of diet in the treatment of diabetes and the relationship between the three main macronutrients and blood glucose level;Food label reading, portion sizes and measuring food.;Carbohydrate counting   Physical activity and exercise  Role of exercise on diabetes management, blood pressure control and cardiac health.   Medications Reviewed patients medication for diabetes, action, purpose, timing of dose and side effects.   Monitoring Purpose and frequency of SMBG.;Identified appropriate SMBG and/or A1C  goals.   Acute complications Taught treatment of hypoglycemia - the 15 rule.   Chronic complications Relationship between chronic complications and blood glucose control   Psychosocial adjustment Role of stress on diabetes   Individualized Goals (developed by patient)   Nutrition Follow meal plan discussed   Physical Activity Exercise 3-5 times per week   Medications take my medication as prescribed   Monitoring  test blood glucose pre and post meals as discussed   Reducing Risk  examine blood glucose patterns   Post-Education Assessment   Patient understands the diabetes disease and treatment process. Demonstrates understanding / competency   Patient understands incorporating nutritional management into lifestyle. Demonstrates understanding / competency   Patient undertands incorporating physical activity into lifestyle. Demonstrates understanding / competency   Patient understands using medications safely. Demonstrates understanding / competency   Patient understands monitoring blood glucose, interpreting and using results Demonstrates understanding / competency   Patient understands prevention, detection, and treatment of acute complications. Demonstrates understanding / competency   Patient understands prevention, detection, and treatment of chronic complications. Demonstrates understanding / competency   Outcomes   Expected Outcomes Demonstrated interest in learning. Expect positive outcomes   Future DMSE PRN   Program Status Completed      Individualized Plan for Diabetes Self-Management Training:   Learning Objective:  Patient will have a greater understanding of diabetes self-management. Patient education plan is to attend individual and/or group sessions per assessed needs and concerns.   Plan:   Patient Instructions  Plan:  Aim for 3 Carb Choices per meal (45 grams) +/- 1 either way  Aim for 0-1 Carbs per snack if hungry  Include protein in moderation with your meals and snacks Consider reading food labels for Total Carbohydrate of foods Continue with your activity level by walking and or bowling daily as tolerated Consider checking BG at alternate times per day  Continue taking diabetes medications Metformin and Actos as directed by MD      Expected Outcomes:  Demonstrated interest in learning. Expect positive outcomes  Education material provided: Living Well with Diabetes, Food label handouts, Meal plan card and Carbohydrate counting  sheet  If problems or questions, patient to contact team via:  Phone and Email  Future DSME appointment: PRN

## 2015-11-08 NOTE — Patient Instructions (Signed)
Plan:  Aim for 3 Carb Choices per meal (45 grams) +/- 1 either way  Aim for 0-1 Carbs per snack if hungry  Include protein in moderation with your meals and snacks Consider reading food labels for Total Carbohydrate of foods Continue with your activity level by walking and or bowling daily as tolerated Consider checking BG at alternate times per day  Continue taking diabetes medications Metformin and Actos as directed by MD

## 2015-11-22 ENCOUNTER — Other Ambulatory Visit: Payer: Self-pay | Admitting: Cardiology

## 2015-12-21 ENCOUNTER — Other Ambulatory Visit: Payer: Self-pay | Admitting: Cardiology

## 2016-01-04 ENCOUNTER — Encounter: Payer: Self-pay | Admitting: Cardiology

## 2016-01-04 ENCOUNTER — Ambulatory Visit (INDEPENDENT_AMBULATORY_CARE_PROVIDER_SITE_OTHER): Payer: Medicare Other | Admitting: Cardiology

## 2016-01-04 VITALS — BP 130/70 | HR 68 | Ht 64.0 in | Wt 163.4 lb

## 2016-01-04 DIAGNOSIS — I471 Supraventricular tachycardia: Secondary | ICD-10-CM | POA: Diagnosis not present

## 2016-01-04 DIAGNOSIS — E7800 Pure hypercholesterolemia, unspecified: Secondary | ICD-10-CM

## 2016-01-04 DIAGNOSIS — I10 Essential (primary) hypertension: Secondary | ICD-10-CM | POA: Diagnosis not present

## 2016-01-04 MED ORDER — METOPROLOL SUCCINATE ER 25 MG PO TB24: 25.0000 mg | ORAL_TABLET | Freq: Every day | ORAL | Status: DC

## 2016-01-04 NOTE — Progress Notes (Signed)
[address] [place], [place]  [address] Phone: [phone]([phone] Fax:  [phone]  Date:  01/04/2016   ID:  Candace Baker, DOB [birth year], MRN [id number]  PCP:  Mickie HillierLITTLE,KEVIN LORNE, MD   History of Present Illness: Candace IsaacsSelena H Baker is a 65 y.o. female with diabetes, hypertension, hyperlipidemia, nuclear stress test in March of 2011 with normal EF but brief nonsustained wide-complex tachycardia question SVT with aberrancy here for followup.Since being on the metoprolol she's been doing very well, no significant palpitations, no syncope, no shortness of breath, no chest pain. She states that since taking the metoprolol she has not felt any fluttering sensation. occasionally she will have a thud-like sensation. Sounds like a PVC.  She has gained some weight recently with the stress of her daughter's illness.   In regards to hyperlipidemia, excellent control by Dr. Clarene DukeLittle.  Blood pressure also has been excellently controlled.    Wt Readings from Last 3 Encounters:  01/04/16 163 lb 6.4 oz (74.118 kg)  11/08/15 156 lb 3.2 oz (70.852 kg)  08/25/15 155 lb (70.308 kg)     Past Medical History  Diagnosis Date  . Endometriosis   . Gallstones   . STD (sexually transmitted disease)     HSV  . Elevated cholesterol   . Hypertension   . Diabetes mellitus     Past Surgical History  Procedure Laterality Date  . Abdominal hysterectomy  1989    TAH   . Oophorectomy  1991    BSO  . Cholecystectomy    . Tonsillectomy      Current Outpatient Prescriptions  Medication Sig Dispense Refill  . aspirin 81 MG tablet Take 81 mg by mouth.      Marland Kitchen. atorvastatin (LIPITOR) 20 MG tablet Take 20 mg by mouth daily. Reported on 08/25/2015    . clindamycin (CLEOCIN) 2 % vaginal cream Place 1 Applicatorful vaginally at bedtime. 40 g 0  . estradiol (ESTRACE) 1 MG tablet Take 1 tablet (1 mg total) by mouth daily. 90 tablet 4  . Garlic Oil 500 MG CAPS Take 1 capsule by mouth daily.    .  hydrochlorothiazide (HYDRODIURIL) 25 MG tablet 1/4 tablet by mouth daily  2  . metFORMIN (GLUMETZA) 1000 MG (MOD) 24 hr tablet Take 1,000 mg by mouth 2 (two) times daily with a meal.      . metoprolol succinate (TOPROL-XL) 25 MG 24 hr tablet Take 1 tablet (25 mg total) by mouth daily. 90 tablet 3  . pantoprazole (PROTONIX) 40 MG tablet Take 40 mg by mouth daily.    . pioglitazone (ACTOS) 15 MG tablet Take 15 mg by mouth daily.  3  . telmisartan (MICARDIS) 80 MG tablet Take 80 mg by mouth.      . valACYclovir (VALTREX) 1000 MG tablet Take 0.5 tablets (500 mg total) by mouth daily. 30 tablet 11   No current facility-administered medications for this visit.    Allergies:    Allergies  Allergen Reactions  . Tiazac [Diltiazem Hcl]     Social History:  The patient  reports that she has never smoked. She does not have any smokeless tobacco history on file. She reports that she drinks alcohol. She reports that she does not use illicit drugs.   Family History  Problem Relation Age of Onset  . Sarcoidosis Mother   . Hypertension Maternal Grandfather   . Heart disease Paternal Grandfather     NOT INDICATED IF PATERNAL OR  MATERNA GF    ROS:  Please see the history of present illness.   Denies any fevers, chills, orthopnea, PND   All other systems reviewed and negative.   PHYSICAL EXAM: VS:  BP 130/70 mmHg  Pulse 68  Ht 5\' 4"  (1.626 m)  Wt 163 lb 6.4 oz (74.118 kg)  BMI 28.03 kg/m2 Well nourished, well developed, in no acute distress HEENT: normal, [place]/AT, EOMI Neck: no JVD, normal carotid upstroke, no bruit Cardiac:  normal S1, S2; RRR; no murmur Lungs:  clear to auscultation bilaterally, no wheezing, rhonchi or rales Abd: soft, nontender, no hepatomegaly, no bruits Ext: no edema, 2+ distal pulses Skin: warm and dry GU: deferred Neuro: no focal abnormalities noted, AAO x 3  EKG:  EKG was ordered today 01/04/16-sinus rhythm 66, no other abnormalities. 02/18/14-sinus rhythm, 76, no other  abnormalities  ASSESSMENT AND PLAN:  1. PSVT-metoprolol. Doing well. No significant palpitations. Every now and then she will have what sounds like a PVC, thud-like description. No high-risk symptoms such as syncope. 2. Hyperlipidemia--- doing well. Prior lab work reassuring. 3. Hypertension-currently well controlled. Medications reviewed 4. Diabetes-metformin. Dr. Clarene DukeLittle, mild weight gain. She is working on this. She has been under increased stress with the illness of her daughter.  Signed, Donato SchultzMark Skains, MD [facility]  01/04/2016 3:30 PM

## 2016-01-04 NOTE — Patient Instructions (Signed)

## 2016-08-26 ENCOUNTER — Encounter: Payer: Medicare Other | Admitting: Gynecology

## 2016-09-04 ENCOUNTER — Encounter: Payer: Self-pay | Admitting: Gynecology

## 2016-09-04 ENCOUNTER — Encounter: Payer: Medicare Other | Admitting: Gynecology

## 2016-09-04 ENCOUNTER — Ambulatory Visit (INDEPENDENT_AMBULATORY_CARE_PROVIDER_SITE_OTHER): Payer: Medicare Other | Admitting: Gynecology

## 2016-09-04 VITALS — BP 128/80 | Ht 64.0 in | Wt 170.0 lb

## 2016-09-04 DIAGNOSIS — Z79890 Hormone replacement therapy (postmenopausal): Secondary | ICD-10-CM | POA: Diagnosis not present

## 2016-09-04 DIAGNOSIS — Z01419 Encounter for gynecological examination (general) (routine) without abnormal findings: Secondary | ICD-10-CM

## 2016-09-04 DIAGNOSIS — Z780 Asymptomatic menopausal state: Secondary | ICD-10-CM | POA: Diagnosis not present

## 2016-09-04 MED ORDER — ESTRADIOL 0.5 MG PO TABS: 0.5000 mg | ORAL_TABLET | Freq: Every day | ORAL | 11 refills | Status: DC

## 2016-09-04 NOTE — Progress Notes (Signed)
Candace IsaacsSelena H Baker May 02, 1951 [id number]   History:    66 y.o.  for annual gyn exam with no complaints today. Review of patient's record indicated that patient back in 1989 had a total abdominal hysterectomy with bilateral salpingo-oophorectomy secondary to endometriosis and she has been on estrogen replacement therapy since that time. She is currently on Estrace 1 mg daily.She also takes Valtrex for suppression of HSV. She has no complaints today. Her primary care physician is Dr. Clarene DukeLittle. Patient with no prior history of abnormal Pap smears. Her last bone density study was normal in 2015. Her colonoscopy was normal less than 10 years ago.  Past medical history,surgical history, family history and social history were all reviewed and documented in the EPIC chart.  Gynecologic History No LMP recorded. Patient has had a hysterectomy. Contraception: status post hysterectomy Last Pap: 2012. Results were: normal Last mammogram: 2018. Results were: Normal but dense had three-dimensional mammogram  Obstetric History OB History  Gravida Para Term Preterm AB Living  2 2 2     2   SAB TAB Ectopic Multiple Live Births               # Outcome Date GA Lbr Len/2nd Weight Sex Delivery Anes PTL Lv  2 Term           1 Term                ROS: A ROS was performed and pertinent positives and negatives are included in the history.  GENERAL: No fevers or chills. HEENT: No change in vision, no earache, sore throat or sinus congestion. NECK: No pain or stiffness. CARDIOVASCULAR: No chest pain or pressure. No palpitations. PULMONARY: No shortness of breath, cough or wheeze. GASTROINTESTINAL: No abdominal pain, nausea, vomiting or diarrhea, melena or bright red blood per rectum. GENITOURINARY: No urinary frequency, urgency, hesitancy or dysuria. MUSCULOSKELETAL: No joint or muscle pain, no back pain, no recent trauma. DERMATOLOGIC: No rash, no itching, no lesions. ENDOCRINE: No polyuria, polydipsia, no heat or  cold intolerance. No recent change in weight. HEMATOLOGICAL: No anemia or easy bruising or bleeding. NEUROLOGIC: No headache, seizures, numbness, tingling or weakness. PSYCHIATRIC: No depression, no loss of interest in normal activity or change in sleep pattern.     Exam: chaperone present  BP 128/80   Ht 5\' 4"  (1.626 m)   Wt 170 lb (77.1 kg)   BMI 29.18 kg/m   Body mass index is 29.18 kg/m.  General appearance : Well developed well nourished female. No acute distress HEENT: Eyes: no retinal hemorrhage or exudates,  Neck supple, trachea midline, no carotid bruits, no thyroidmegaly Lungs: Clear to auscultation, no rhonchi or wheezes, or rib retractions  Heart: Regular rate and rhythm, no murmurs or gallops Breast:Examined in sitting and supine position were symmetrical in appearance, no palpable masses or tenderness,  no skin retraction, no nipple inversion, no nipple discharge, no skin discoloration, no axillary or supraclavicular lymphadenopathy Abdomen: no palpable masses or tenderness, no rebound or guarding Extremities: no edema or skin discoloration or tenderness  Pelvic:  Bartholin, Urethra, Skene Glands: Within normal limits             Vagina: No gross lesions or discharge  Cervix: Absent  Uterus absent  Adnexa  Without masses or tenderness  Anus and perineum  normal   Rectovaginal  normal sphincter tone without palpated masses or tenderness             Hemoccult PCP provides  Assessment/Plan:  66 y.o. female for annual exam who needs to begin to taper down her estrogen replacement therapy. We are going to start her on Estrace 0.5 mg and then with the hopes of next year coming off altogether. Patient to return to the office next week for her bone density study. We discussed importance of calcium vitamin D and weightbearing exercises for osteoporosis prevention. Pap smear no longer indicated according to the new guidelines. PCP has been doing her blood work and her  vaccines are up-to-date.   Ok Edwards MD, 3:41 PM 09/04/2016

## 2016-09-04 NOTE — Patient Instructions (Signed)
Bone Densitometry Bone densitometry is an imaging test that uses a special X-ray to measure the amount of calcium and other minerals in your bones (bone density). This test is also known as a bone mineral density test or dual-energy X-ray absorptiometry (DXA). The test can measure bone density at your hip and your spine. It is similar to having a regular X-ray. You may have this test to:  Diagnose a condition that causes weak or thin bones (osteoporosis).  Predict your risk of a broken bone (fracture).  Determine how well osteoporosis treatment is working. Tell a health care provider about:  Any allergies you have.  All medicines you are taking, including vitamins, herbs, eye drops, creams, and over-the-counter medicines.  Any problems you or family members have had with anesthetic medicines.  Any blood disorders you have.  Any surgeries you have had.  Any medical conditions you have.  Possibility of pregnancy.  Any other medical test you had within the previous 14 days that used contrast material. What are the risks? Generally, this is a safe procedure. However, problems can occur and may include the following:  This test exposes you to a very small amount of radiation.  The risks of radiation exposure may be greater to unborn children. What happens before the procedure?  Do not take any calcium supplements for 24 hours before having the test. You can otherwise eat and drink what you usually do.  Take off all metal jewelry, eyeglasses, dental appliances, and any other metal objects. What happens during the procedure?  You may lie on an exam table. There will be an X-ray generator below you and an imaging device above you.  Other devices, such as boxes or braces, may be used to position your body properly for the scan.  You will need to lie still while the machine slowly scans your body.  The images will show up on a computer monitor. What happens after the  procedure? You may need more testing at a later time. This information is not intended to replace advice given to you by your health care provider. Make sure you discuss any questions you have with your health care provider. Document Released: 07/16/2004 Document Revised: 11/30/2015 Document Reviewed: 12/02/2013 Elsevier Interactive Patient Education  2017 Elsevier Inc.  

## 2016-10-25 ENCOUNTER — Other Ambulatory Visit: Payer: Self-pay | Admitting: Gynecology

## 2016-11-04 ENCOUNTER — Ambulatory Visit (INDEPENDENT_AMBULATORY_CARE_PROVIDER_SITE_OTHER): Payer: Medicare Other

## 2016-11-04 DIAGNOSIS — Z780 Asymptomatic menopausal state: Secondary | ICD-10-CM

## 2016-11-04 DIAGNOSIS — Z79890 Hormone replacement therapy (postmenopausal): Secondary | ICD-10-CM

## 2016-11-05 ENCOUNTER — Other Ambulatory Visit: Payer: Self-pay | Admitting: Gynecology

## 2016-11-05 DIAGNOSIS — Z780 Asymptomatic menopausal state: Secondary | ICD-10-CM

## 2016-11-20 ENCOUNTER — Encounter: Payer: Self-pay | Admitting: Gynecology

## 2016-12-24 ENCOUNTER — Other Ambulatory Visit: Payer: Self-pay | Admitting: Gynecology

## 2017-01-25 ENCOUNTER — Other Ambulatory Visit: Payer: Self-pay | Admitting: Cardiology

## 2017-02-26 ENCOUNTER — Other Ambulatory Visit: Payer: Self-pay | Admitting: *Deleted

## 2017-02-26 MED ORDER — VALACYCLOVIR HCL 1 G PO TABS: ORAL_TABLET | ORAL | 5 refills | Status: DC

## 2017-03-01 ENCOUNTER — Other Ambulatory Visit: Payer: Self-pay | Admitting: Cardiology

## 2017-03-23 ENCOUNTER — Other Ambulatory Visit: Payer: Self-pay | Admitting: Cardiology

## 2017-04-06 ENCOUNTER — Other Ambulatory Visit: Payer: Self-pay | Admitting: Cardiology

## 2017-05-04 ENCOUNTER — Other Ambulatory Visit: Payer: Self-pay | Admitting: Cardiology

## 2017-05-07 ENCOUNTER — Other Ambulatory Visit: Payer: Self-pay | Admitting: Cardiology

## 2017-05-07 ENCOUNTER — Ambulatory Visit: Payer: Medicare Other | Admitting: Cardiology

## 2017-06-03 NOTE — Progress Notes (Signed)
CARDIOLOGY OFFICE NOTE  Date:  06/04/2017    Candace Baker Date of Birth: 04-27-1951 Medical Record #[id number]  PCP:  Catha GosselinLittle, Kevin, MD  Cardiologist:  [facility]    Chief Complaint  Patient presents with  . Hypertension  . Hyperlipidemia    Follow up visit - seen for Dr. Anne FuSkains    History of Present Illness: Candace Baker is a 66 y.o. female who presents today for a follow up visit. Seen for Dr. Anne FuSkains.   She has a history of diabetes, hypertension, hyperlipidemia, prior nuclear stress test in March of 2011 with normal EF but brief nonsustained wide-complex tachycardia question SVT with aberrancy - on beta blocker therapy.   Seen last June of 2017 - felt to be doing well.   Comes in today. Here alone. Big cowboys fan. She feels like she has done well since last visit. Rare lightheaded/palpitation feeling - clears with cough or with turning her head. Not as often as she was having in the past. No chest pain. She likes to bowl. 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  Tries to stay active. Tolerating her medicines and needs Toprol refilled. Lipids from October noted - LDL quite high - she tells me she is having her statin therapy changed by PCP - just has to go pick up.   Past Medical History:  Diagnosis Date  . Diabetes mellitus   . Elevated cholesterol   . Endometriosis   . Gallstones   . Hypertension   . STD (sexually transmitted disease)    HSV    Past Surgical History:  Procedure Laterality Date  . ABDOMINAL HYSTERECTOMY  1989   TAH   . CHOLECYSTECTOMY    . OOPHORECTOMY  1991   BSO  . TONSILLECTOMY       Medications: Current Meds  Medication Sig  . aspirin 81 MG tablet Take 81 mg by mouth.    . Cholecalciferol (VITAMIN D3) 2000 units TABS Take 2,000 mg by mouth daily.  . hydrochlorothiazide (HYDRODIURIL) 25 MG tablet 1/4 tablet by mouth daily  . metFORMIN (GLUMETZA) 1000 MG (MOD) 24 hr tablet Take 1,000 mg by mouth 2 (two) times daily with a  meal.    . metoprolol succinate (TOPROL-XL) 25 MG 24 hr tablet Take 1 tablet (25 mg total) by mouth daily. Please keep upcoming appt for future refills. Thanks  . pantoprazole (PROTONIX) 40 MG tablet Take 40 mg by mouth daily.  Marland Kitchen. telmisartan (MICARDIS) 80 MG tablet Take 80 mg by mouth.    . valACYclovir (VALTREX) 1000 MG tablet TAKE 1/2 TABLETS (500 MG TOTAL) BY MOUTH DAILY.  . [DISCONTINUED] atorvastatin (LIPITOR) 20 MG tablet Take 20 mg by mouth daily. Reported on 08/25/2015  . [DISCONTINUED] estradiol (ESTRACE) 0.5 MG tablet Take 1 tablet (0.5 mg total) by mouth daily.  . [DISCONTINUED] Garlic Oil 500 MG CAPS Take 1 capsule by mouth daily.  . [DISCONTINUED] metoprolol succinate (TOPROL-XL) 25 MG 24 hr tablet Take 1 tablet (25 mg total) by mouth daily. Please keep upcoming appt for future refills. Thanks  . [DISCONTINUED] pioglitazone (ACTOS) 15 MG tablet Take 15 mg by mouth daily.     Allergies: Allergies  Allergen Reactions  . Tiazac [Diltiazem Hcl]     Social History: The patient  reports that  has never smoked. she has never used smokeless tobacco. She reports that she drinks alcohol. She reports that she does not use drugs.   Family History: The patient's family history includes  Heart disease in her paternal grandfather; Hypertension in her maternal grandfather; Sarcoidosis in her mother.   Review of Systems: Please see the history of present illness.   Otherwise, the review of systems is positive for none.   All other systems are reviewed and negative.   Physical Exam: VS:  BP 134/80   Pulse 66   Ht 5\' 4"  (1.626 m)   Wt 162 lb 1.9 oz (73.5 kg)   BMI 27.83 kg/m  .  BMI Body mass index is 27.83 kg/m.  Wt Readings from Last 3 Encounters:  06/04/17 162 lb 1.9 oz (73.5 kg)  09/04/16 170 lb (77.1 kg)  01/04/16 163 lb 6.4 oz (74.1 kg)    General: Pleasant. Well developed, well nourished and in no acute distress.   HEENT: Normal.  Neck: Supple, no JVD, carotid bruits, or  masses noted.  Cardiac: Regular rate and rhythm. No murmurs, rubs, or gallops. No edema.  Respiratory:  Lungs are clear to auscultation bilaterally with normal work of breathing.  GI: Soft and nontender.  MS: No deformity or atrophy. Gait and ROM intact.  Skin: Warm and dry. Color is normal.  Neuro:  Strength and sensation are intact and no gross focal deficits noted.  Psych: Alert, appropriate and with normal affect.   LABORATORY DATA:  EKG:  EKG is ordered today. This demonstrates NSR with more anterolateral Twave inversion - more so on today's tracing than in the past.  Lab Results  Component Value Date   WBC 8.5 09/01/2014   HGB 12.1 09/01/2014   HCT 36.7 09/01/2014   PLT 272 09/01/2014   GLUCOSE 160 (H) 09/01/2014   NA 136 09/01/2014   K 3.6 09/01/2014   CL 98 09/01/2014   CREATININE 0.95 09/01/2014   BUN 13 09/01/2014   CO2 27 09/01/2014       BNP (last 3 results) No results for input(s): BNP in the last 8760 hours.  ProBNP (last 3 results) No results for input(s): PROBNP in the last 8760 hours.   Other Studies Reviewed Today:   Assessment/Plan:  1.PSVT - maintained on beta blocker therapy - very little palpitations. Toprol refilled today.  2. HLD - has just been switched to different statin - managed by PCP  3. HTN - BP looks good here today - no changes  4. DM - managed by PCP  5. Abnormal EKG - will arrange for stress Myoview - has several risk factors (HTN, DM, HLD). No symptoms noted.    Current medicines are reviewed with the patient today.  The patient does not have concerns regarding medicines other than what has been noted above.  The following changes have been made:  See above.  Labs/ tests ordered today include:    Orders Placed This Encounter  Procedures  . MYOCARDIAL PERFUSION IMAGING  . EKG 12-Lead     Disposition:   FU with Dr. Anne FuSkains in one year unless stress test is abnormal.    Patient is agreeable to this plan and will  call if any problems develop in the interim.   SignedNorma Fredrickson: Janice Seales, NP  06/04/2017 2:01 PM  [facility] [address] [place], [place]  [address] Phone: [phone] Fax: [phone]

## 2017-06-04 ENCOUNTER — Encounter (INDEPENDENT_AMBULATORY_CARE_PROVIDER_SITE_OTHER): Payer: Self-pay

## 2017-06-04 ENCOUNTER — Encounter: Payer: Self-pay | Admitting: Nurse Practitioner

## 2017-06-04 ENCOUNTER — Ambulatory Visit: Payer: Medicare Other | Admitting: Nurse Practitioner

## 2017-06-04 VITALS — BP 134/80 | HR 66 | Ht 64.0 in | Wt 162.1 lb

## 2017-06-04 DIAGNOSIS — R9431 Abnormal electrocardiogram [ECG] [EKG]: Secondary | ICD-10-CM | POA: Diagnosis not present

## 2017-06-04 MED ORDER — METOPROLOL SUCCINATE ER 25 MG PO TB24: 25.0000 mg | ORAL_TABLET | Freq: Every day | ORAL | 3 refills | Status: DC

## 2017-06-04 NOTE — Patient Instructions (Addendum)
We will be checking the following labs today - NONE   Medication Instructions:    Continue with your current medicines.  I have refilled your Toprol today     Testing/Procedures To Be Arranged:  Stress Myovew  Follow-Up:   See Dr. Anne FuSkains in one year - as long as your stress test turns out ok.     Other Special Instructions:   N/A    If you need a refill on your cardiac medications before your next appointment, please call your pharmacy.   Call the [facility] office at [phone] if you have any questions, problems or concerns.

## 2017-06-09 ENCOUNTER — Telehealth (HOSPITAL_COMMUNITY): Payer: Self-pay | Admitting: *Deleted

## 2017-06-09 NOTE — Telephone Encounter (Signed)
Left message on voicemail per DPR in reference to upcoming appointment scheduled on 06/11/17 at 0730 with detailed instructions given per Myocardial Perfusion Study Information Sheet for the test. LM to arrive 15 minutes early, and that it is imperative to arrive on time for appointment to keep from having the test rescheduled. If you need to cancel or reschedule your appointment, please call the office within 24 hours of your appointment. Failure to do so may result in a cancellation of your appointment, and a $50 no show fee. Phone number given for call back for any questions.    

## 2017-06-11 ENCOUNTER — Ambulatory Visit (HOSPITAL_COMMUNITY): Payer: Medicare Other | Attending: Cardiology

## 2017-06-11 DIAGNOSIS — R9431 Abnormal electrocardiogram [ECG] [EKG]: Secondary | ICD-10-CM | POA: Diagnosis not present

## 2017-06-11 LAB — MYOCARDIAL PERFUSION IMAGING
Estimated workload: 4.8 METS
Exercise duration (min): 4 min
Exercise duration (sec): 30 s
LV dias vol: 57 mL (ref 46–106)
LV sys vol: 20 mL
MPHR: 152 {beats}/min
Peak HR: 136 {beats}/min
Percent HR: 90 %
RATE: 0.32
Rest HR: 57 {beats}/min
SDS: 1
SRS: 1
SSS: 2
TID: 1.05

## 2017-06-11 MED ORDER — TECHNETIUM TC 99M TETROFOSMIN IV KIT
32.0000 | PACK | Freq: Once | INTRAVENOUS | Status: AC | PRN
  Administered 2017-06-11: 32 via INTRAVENOUS
  Filled 2017-06-11: qty 32

## 2017-06-11 MED ORDER — TECHNETIUM TC 99M TETROFOSMIN IV KIT
10.5000 | PACK | Freq: Once | INTRAVENOUS | Status: AC | PRN
  Administered 2017-06-11: 10.5 via INTRAVENOUS
  Filled 2017-06-11: qty 11

## 2017-06-13 ENCOUNTER — Telehealth: Payer: Self-pay | Admitting: *Deleted

## 2017-06-13 DIAGNOSIS — I10 Essential (primary) hypertension: Secondary | ICD-10-CM

## 2017-06-13 NOTE — Telephone Encounter (Signed)
lmtcb ./cy 

## 2017-06-13 NOTE — Telephone Encounter (Signed)
-----   Message from Rosalio MacadamiaLori C Gerhardt, NP sent at 06/11/2017  5:09 PM EST ----- Ok to report. Low risk Myoview. Normal pumping function. Does have poor exercise tolerance. BP up with exercise.  Would monitor BP at home. Goal 135/85.  Would increase HCTZ to 1/2 tab daily - if agreeable - recheck BMET in 2 weeks.  Start walking program - start slow and work up to goal of 45 minutes a day.

## 2017-06-13 NOTE — Progress Notes (Unsigned)
Lm to call back ./cy 

## 2017-06-18 MED ORDER — HYDROCHLOROTHIAZIDE 25 MG PO TABS: ORAL_TABLET | ORAL | 2 refills | Status: DC

## 2017-06-18 NOTE — Telephone Encounter (Signed)
Discussed results and recommendations with patient, she verbalized understanding, agreed with plan

## 2017-06-18 NOTE — Addendum Note (Signed)
Addended by: Jacqlyn KraussLANKFORD, Rushi Chasen M on: 06/18/2017 12:38 PM   Modules accepted: Orders

## 2017-07-03 ENCOUNTER — Other Ambulatory Visit: Payer: Medicare Other | Admitting: *Deleted

## 2017-07-03 DIAGNOSIS — I10 Essential (primary) hypertension: Secondary | ICD-10-CM

## 2017-07-03 LAB — BASIC METABOLIC PANEL
BUN/Creatinine Ratio: 10 — ABNORMAL LOW (ref 12–28)
BUN: 10 mg/dL (ref 8–27)
CO2: 26 mmol/L (ref 20–29)
Calcium: 9.5 mg/dL (ref 8.7–10.3)
Chloride: 105 mmol/L (ref 96–106)
Creatinine, Ser: 1.04 mg/dL — ABNORMAL HIGH (ref 0.57–1.00)
GFR calc Af Amer: 64 mL/min/{1.73_m2} (ref >59)
GFR calc non Af Amer: 55 mL/min/{1.73_m2} — ABNORMAL LOW (ref >59)
Glucose: 154 mg/dL — ABNORMAL HIGH (ref 65–99)
Potassium: 4.1 mmol/L (ref 3.5–5.2)
Sodium: 144 mmol/L (ref 134–144)

## 2017-12-23 ENCOUNTER — Encounter: Payer: Medicare Other | Admitting: Gynecology

## 2018-01-30 ENCOUNTER — Encounter: Payer: Medicare Other | Admitting: Gynecology

## 2018-02-03 ENCOUNTER — Ambulatory Visit: Payer: Medicare Other | Admitting: Gynecology

## 2018-02-03 ENCOUNTER — Encounter: Payer: Self-pay | Admitting: Gynecology

## 2018-02-03 VITALS — BP 120/72 | Ht 64.0 in | Wt 155.0 lb

## 2018-02-03 DIAGNOSIS — Z01419 Encounter for gynecological examination (general) (routine) without abnormal findings: Secondary | ICD-10-CM | POA: Diagnosis not present

## 2018-02-03 DIAGNOSIS — N952 Postmenopausal atrophic vaginitis: Secondary | ICD-10-CM | POA: Diagnosis not present

## 2018-02-03 NOTE — Progress Notes (Signed)
    Ardell IsaacsSelena H Haymer 02/22/1951 [id number]        67 y.o.  G2P2002 for annual gynecologic exam.  Former patient of Dr. Lily PeerFernandez.  Without gynecologic complaints.  Had been on estradiol but discontinued and doing well without significant hot flushes or sweats.  Past medical history,surgical history, problem list, medications, allergies, family history and social history were all reviewed and documented as reviewed in the EPIC chart.  ROS:  Performed with pertinent positives and negatives included in the history, assessment and plan.   Additional significant findings : None   Exam: Bari MantisKim Alexis assistant Vitals:   02/03/18 1554  BP: 120/72  Weight: 155 lb (70.3 kg)  Height: 5\' 4"  (1.626 m)   Body mass index is 26.61 kg/m.  General appearance:  Normal affect, orientation and appearance. Skin: Grossly normal HEENT: Without gross lesions.  No cervical or supraclavicular adenopathy. Thyroid normal.  Lungs:  Clear without wheezing, rales or rhonchi Cardiac: RR, without RMG Abdominal:  Soft, nontender, without masses, guarding, rebound, organomegaly or hernia Breasts:  Examined lying and sitting without masses, retractions, discharge or axillary adenopathy. Pelvic:  Ext, BUS, Vagina: With atrophic changes  Adnexa: Without masses or tenderness    Anus and perineum: Normal   Rectovaginal: Normal sphincter tone without palpated masses or tenderness.    Assessment/Plan:  67 y.o. 342P2002 female for annual gynecologic exam status post TAH BSO in the past.  1. Postmenopausal.  Had been on estradiol but discontinued.  Having some hot flashes but tolerable. 2. Pap smear 2012.  No Pap smear done today.  No history of significant abnormal Pap smears.  We both agree to stop screening per current screening guidelines based on age and hysterectomy history. 3. Mammography overdue.  Patient unsure whether she actually had it this year or not.  She is going to call [facility] to confirm.  If not then she  will schedule.  Breast exam normal today. 4. DEXA 2018 normal.  Plan repeat DEXA at 5-year interval. 5. Colonoscopy 2019.  Repeat at their recommended interval. 6. Health maintenance.  No routine lab work done as patient does this elsewhere.  Follow-up 1 year, sooner as needed.   Dara Lordsimothy P Fontaine MD, 4:29 PM 02/03/2018

## 2018-02-03 NOTE — Patient Instructions (Signed)
Follow-up in 1 year, sooner as needed. 

## 2018-07-04 ENCOUNTER — Other Ambulatory Visit: Payer: Self-pay | Admitting: Nurse Practitioner

## 2018-07-04 DIAGNOSIS — R9431 Abnormal electrocardiogram [ECG] [EKG]: Secondary | ICD-10-CM

## 2018-07-21 ENCOUNTER — Other Ambulatory Visit: Payer: Self-pay | Admitting: Nurse Practitioner

## 2018-07-21 DIAGNOSIS — R9431 Abnormal electrocardiogram [ECG] [EKG]: Secondary | ICD-10-CM

## 2018-07-21 MED ORDER — METOPROLOL SUCCINATE ER 25 MG PO TB24: 25.0000 mg | ORAL_TABLET | Freq: Every day | ORAL | 0 refills | Status: DC

## 2018-07-21 NOTE — Addendum Note (Signed)
Addended by: Demetrios Loll on: 07/21/2018 10:11 AM   Modules accepted: Orders

## 2018-08-11 ENCOUNTER — Telehealth: Payer: Self-pay | Admitting: *Deleted

## 2018-08-11 MED ORDER — ACYCLOVIR 400 MG PO TABS: ORAL_TABLET | ORAL | 5 refills | Status: DC

## 2018-08-11 NOTE — Telephone Encounter (Signed)
Patient called stating valacyclovir 1000 tablet price has increased to $80 for 30 day supply. Patient asked if another medication could be prescribed? I did explain to patient we are not aware of cost of medication. Please advise

## 2018-08-11 NOTE — Telephone Encounter (Signed)
She can try acyclovir 400 mg daily as a suppressive dose or 400 mg twice daily to treat acute outbreaks to be taken for several days at earliest onset of outbreak.

## 2018-08-11 NOTE — Telephone Encounter (Signed)
Patient informed, Rx sent.  

## 2018-08-24 ENCOUNTER — Other Ambulatory Visit: Payer: Self-pay | Admitting: Nurse Practitioner

## 2018-08-24 DIAGNOSIS — R9431 Abnormal electrocardiogram [ECG] [EKG]: Secondary | ICD-10-CM

## 2018-09-15 ENCOUNTER — Other Ambulatory Visit: Payer: Self-pay

## 2018-09-15 ENCOUNTER — Encounter (HOSPITAL_COMMUNITY): Payer: Self-pay | Admitting: Emergency Medicine

## 2018-09-15 ENCOUNTER — Emergency Department (HOSPITAL_COMMUNITY): Payer: Medicare Other

## 2018-09-15 ENCOUNTER — Observation Stay (HOSPITAL_COMMUNITY)
Admission: EM | Admit: 2018-09-15 | Discharge: 2018-09-16 | Disposition: A | Discharge status: Home / Self Care | Payer: Medicare Other | Attending: Internal Medicine | Admitting: Internal Medicine

## 2018-09-15 ENCOUNTER — Other Ambulatory Visit: Payer: Self-pay | Admitting: Cardiology

## 2018-09-15 DIAGNOSIS — Z7984 Long term (current) use of oral hypoglycemic drugs: Secondary | ICD-10-CM | POA: Diagnosis not present

## 2018-09-15 DIAGNOSIS — Z7982 Long term (current) use of aspirin: Secondary | ICD-10-CM | POA: Insufficient documentation

## 2018-09-15 DIAGNOSIS — I313 Pericardial effusion (noninflammatory): Secondary | ICD-10-CM | POA: Insufficient documentation

## 2018-09-15 DIAGNOSIS — Z79899 Other long term (current) drug therapy: Secondary | ICD-10-CM | POA: Diagnosis not present

## 2018-09-15 DIAGNOSIS — I10 Essential (primary) hypertension: Secondary | ICD-10-CM | POA: Diagnosis not present

## 2018-09-15 DIAGNOSIS — Z8249 Family history of ischemic heart disease and other diseases of the circulatory system: Secondary | ICD-10-CM | POA: Insufficient documentation

## 2018-09-15 DIAGNOSIS — R350 Frequency of micturition: Secondary | ICD-10-CM | POA: Diagnosis not present

## 2018-09-15 DIAGNOSIS — E7800 Pure hypercholesterolemia, unspecified: Secondary | ICD-10-CM | POA: Diagnosis not present

## 2018-09-15 DIAGNOSIS — R0602 Shortness of breath: Secondary | ICD-10-CM | POA: Diagnosis present

## 2018-09-15 DIAGNOSIS — E119 Type 2 diabetes mellitus without complications: Secondary | ICD-10-CM

## 2018-09-15 DIAGNOSIS — R42 Dizziness and giddiness: Secondary | ICD-10-CM

## 2018-09-15 DIAGNOSIS — N179 Acute kidney failure, unspecified: Secondary | ICD-10-CM | POA: Insufficient documentation

## 2018-09-15 DIAGNOSIS — R0902 Hypoxemia: Secondary | ICD-10-CM | POA: Diagnosis present

## 2018-09-15 DIAGNOSIS — E785 Hyperlipidemia, unspecified: Secondary | ICD-10-CM | POA: Insufficient documentation

## 2018-09-15 DIAGNOSIS — R0789 Other chest pain: Secondary | ICD-10-CM | POA: Diagnosis present

## 2018-09-15 DIAGNOSIS — I471 Supraventricular tachycardia, unspecified: Secondary | ICD-10-CM | POA: Diagnosis present

## 2018-09-15 DIAGNOSIS — R9431 Abnormal electrocardiogram [ECG] [EKG]: Secondary | ICD-10-CM

## 2018-09-15 LAB — CBC
HCT: 40 % (ref 36.0–46.0)
Hemoglobin: 12.7 g/dL (ref 12.0–15.0)
MCH: 28.8 pg (ref 26.0–34.0)
MCHC: 31.8 g/dL (ref 30.0–36.0)
MCV: 90.7 fL (ref 80.0–100.0)
Platelets: 327 10*3/uL (ref 150–400)
RBC: 4.41 MIL/uL (ref 3.87–5.11)
RDW: 13 % (ref 11.5–15.5)
WBC: 9.3 10*3/uL (ref 4.0–10.5)
nRBC: 0 % (ref 0.0–0.2)

## 2018-09-15 LAB — GLUCOSE, CAPILLARY: Glucose-Capillary: 201 mg/dL — ABNORMAL HIGH (ref 70–99)

## 2018-09-15 LAB — BASIC METABOLIC PANEL
Anion gap: 9 (ref 5–15)
BUN: 15 mg/dL (ref 8–23)
CO2: 24 mmol/L (ref 22–32)
Calcium: 10 mg/dL (ref 8.9–10.3)
Chloride: 103 mmol/L (ref 98–111)
Creatinine, Ser: 1.28 mg/dL — ABNORMAL HIGH (ref 0.44–1.00)
GFR, EST AFRICAN AMERICAN: 49 mL/min — AB (ref >60)
GFR, EST NON AFRICAN AMERICAN: 43 mL/min — AB (ref >60)
Glucose, Bld: 177 mg/dL — ABNORMAL HIGH (ref 70–99)
POTASSIUM: 3.9 mmol/L (ref 3.5–5.1)
Sodium: 136 mmol/L (ref 135–145)

## 2018-09-15 LAB — I-STAT TROPONIN, ED
TROPONIN I, POC: 0 ng/mL (ref 0.00–0.08)
Troponin i, poc: 0.01 ng/mL (ref 0.00–0.08)

## 2018-09-15 LAB — D-DIMER, QUANTITATIVE: D-Dimer, Quant: 0.28 ug/mL-FEU (ref 0.00–0.50)

## 2018-09-15 MED ORDER — INSULIN ASPART 100 UNIT/ML ~~LOC~~ SOLN
0.0000 [IU] | Freq: Three times a day (TID) | SUBCUTANEOUS | Status: DC
  Administered 2018-09-16: 1 [IU] via SUBCUTANEOUS

## 2018-09-15 MED ORDER — ACETAMINOPHEN 325 MG PO TABS: 650.0000 mg | ORAL_TABLET | Freq: Four times a day (QID) | ORAL | Status: DC | PRN

## 2018-09-15 MED ORDER — ASPIRIN EC 81 MG PO TBEC
81.0000 mg | DELAYED_RELEASE_TABLET | Freq: Every day | ORAL | Status: DC
  Administered 2018-09-16: 81 mg via ORAL
  Filled 2018-09-15: qty 1

## 2018-09-15 MED ORDER — ENOXAPARIN SODIUM 40 MG/0.4ML ~~LOC~~ SOLN
40.0000 mg | SUBCUTANEOUS | Status: DC
  Administered 2018-09-15: 40 mg via SUBCUTANEOUS
  Filled 2018-09-15: qty 0.4

## 2018-09-15 MED ORDER — SODIUM CHLORIDE 0.9 % IV BOLUS
500.0000 mL | Freq: Once | INTRAVENOUS | Status: AC
  Administered 2018-09-15: 500 mL via INTRAVENOUS

## 2018-09-15 MED ORDER — SODIUM CHLORIDE 0.9% FLUSH
3.0000 mL | Freq: Once | INTRAVENOUS | Status: AC
  Administered 2018-09-15: 3 mL via INTRAVENOUS

## 2018-09-15 MED ORDER — IRBESARTAN 300 MG PO TABS
300.0000 mg | ORAL_TABLET | Freq: Every day | ORAL | Status: DC
  Administered 2018-09-15 – 2018-09-16 (×2): 300 mg via ORAL
  Filled 2018-09-15 (×2): qty 1

## 2018-09-15 MED ORDER — ROSUVASTATIN CALCIUM 5 MG PO TABS
5.0000 mg | ORAL_TABLET | Freq: Every day | ORAL | Status: DC
  Administered 2018-09-15 – 2018-09-16 (×2): 5 mg via ORAL
  Filled 2018-09-15 (×2): qty 1

## 2018-09-15 MED ORDER — METOPROLOL SUCCINATE ER 25 MG PO TB24
25.0000 mg | ORAL_TABLET | Freq: Every day | ORAL | Status: DC
  Administered 2018-09-16: 25 mg via ORAL
  Filled 2018-09-15: qty 1

## 2018-09-15 MED ORDER — SODIUM CHLORIDE 0.9 % IV SOLN
INTRAVENOUS | Status: DC
  Administered 2018-09-15: 900 mL via INTRAVENOUS
  Administered 2018-09-16: 06:00:00 via INTRAVENOUS

## 2018-09-15 MED ORDER — INSULIN ASPART 100 UNIT/ML ~~LOC~~ SOLN
0.0000 [IU] | Freq: Every day | SUBCUTANEOUS | Status: DC
  Administered 2018-09-15: 2 [IU] via SUBCUTANEOUS

## 2018-09-15 MED ORDER — METOPROLOL SUCCINATE ER 25 MG PO TB24: 25.0000 mg | ORAL_TABLET | Freq: Every day | ORAL | 1 refills | Status: DC

## 2018-09-15 MED ORDER — ACETAMINOPHEN 650 MG RE SUPP: 650.0000 mg | Freq: Four times a day (QID) | RECTAL | Status: DC | PRN

## 2018-09-15 MED ORDER — ASPIRIN 81 MG PO CHEW
324.0000 mg | CHEWABLE_TABLET | Freq: Once | ORAL | Status: AC
  Administered 2018-09-15: 324 mg via ORAL
  Filled 2018-09-15: qty 4

## 2018-09-15 NOTE — H&P (Signed)
History and Physical    Candace Baker INO:[id number] DOB: 04-09-1951 DOA: 09/15/2018  PCP: Catha Gosselin, MD  Patient coming from: home, now from office   I have personally briefly reviewed patient's old medical records available.   Chief Complaint: episodic dizziness and feeling of skipped beat.   HPI: Candace Baker is a 68 y.o. female with medical history significant of type 2 diabetes on oral hypoglycemics, hyperlipidemia, hypertension and history of paroxysmal SVT previously investigated with Holter monitor and on beta-blockers presents to the emergency room with multiple episodes of dizziness.  According to the patient, she had similar problems in the past when she was diagnosed with PSVT and is on beta-blockers.  Since last 1 week or so she is getting episodic, intermittent, random episodes where she feels dizzy and she feels like her heart slowed down, no related to posture, it may come when she is lying in the bed.  No relation to exertion.  No palpitation.  Today, along with that feeling she also had some tightness on her lower sternal area.  Each episode last about few seconds with no symptoms in between the events. Denies any cough, cold, flulike symptoms.  Denies any postural dizziness. She does however has fluctuating blood pressures during those events.  She used to be on hydrochlorothiazide and Micadin, patient thinks that symptoms started after she was taken of the hydrochlorothiazide. ED Course: In the emergency room, she is hemodynamically stable.  Slightly elevated creatinine.  Chest x-ray normal.  Twelve-lead EKG shows low voltage normal sinus rhythm.  No arrhythmias. D-dimer was negative.  Initial troponin and subsequent troponins are negative.  Surprisingly, when patient was ambulated she had no symptoms but nurse reported her saturation dropped to 67%.  Patient was not short of breath.  She is saturating normally now.  Review of Systems: As per HPI otherwise 10 point  review of systems negative.    Past Medical History:  Diagnosis Date  . Diabetes mellitus   . Elevated cholesterol   . Endometriosis   . Gallstones   . Hypertension   . STD (sexually transmitted disease)    HSV    Past Surgical History:  Procedure Laterality Date  . ABDOMINAL HYSTERECTOMY  1989   TAH   . CHOLECYSTECTOMY    . OOPHORECTOMY  1991   BSO  . TONSILLECTOMY       reports that she has never smoked. She has never used smokeless tobacco. She reports current alcohol use. She reports that she does not use drugs.  Allergies  Allergen Reactions  . Tiazac [Diltiazem Hcl] Itching and Rash    Skin peeled    Family History  Problem Relation Age of Onset  . Sarcoidosis Mother   . Hypertension Maternal Grandfather   . Heart disease Paternal Grandfather        NOT INDICATED IF PATERNAL OR MATERNA GF     Prior to Admission medications   Medication Sig Start Date End Date Taking? Authorizing Provider  acyclovir (ZOVIRAX) 400 MG tablet Take one tablet by mouth twice daily for 3-5 days then daily as needed. Patient taking differently: Take 400 mg by mouth daily as needed (outbreaks).  08/11/18  Yes Fontaine, Nadyne Coombes, MD  aspirin 81 MG tablet Take 81 mg by mouth.     Yes [provider]  JARDIANCE 10 MG TABS tablet Take 1 tablet by mouth daily. 01/26/18  Yes [provider]  metFORMIN (GLUMETZA) 1000 MG (MOD) 24 hr tablet Take 1,000  mg by mouth 2 (two) times daily with a meal.     Yes [provider]  metoprolol succinate (TOPROL-XL) 25 MG 24 hr tablet Take 1 tablet (25 mg total) by mouth daily. Must keep 11/09/2018 appointment with Dr Anne Fu for further refills 09/15/18  Yes Jake Bathe, MD  rosuvastatin (CRESTOR) 5 MG tablet Take 5 mg by mouth daily.   Yes [provider]  telmisartan (MICARDIS) 80 MG tablet Take 80 mg by mouth.     Yes [provider]    Physical Exam: Vitals:   09/15/18 1435 09/15/18 1445 09/15/18 1600  09/15/18 1645  BP:   137/60 123/61  Pulse:  (!) 59 (!) 50 (!) 59  Resp:  Temp:      TempSrc:      SpO2: 100% 100% 100% 100%    Constitutional: NAD, calm, comfortable Vitals:   09/15/18 1435 09/15/18 1445 09/15/18 1600 09/15/18 1645  BP:   137/60 123/61  Pulse:  (!) 59 (!) 50 (!) 59  Resp:  Temp:      TempSrc:      SpO2: 100% 100% 100% 100%   Eyes: PERRL, lids and conjunctivae normal ENMT: Mucous membranes are moist. Posterior pharynx clear of any exudate or lesions.Normal dentition.  Neck: normal, supple, no masses, no thyromegaly Respiratory: clear to auscultation bilaterally, no wheezing, no crackles. Normal respiratory effort. No accessory muscle use.  Cardiovascular: Regular rate and rhythm, no murmurs / rubs / gallops. No extremity edema. 2+ pedal pulses. No carotid bruits.  Abdomen: no tenderness, no masses palpated. No hepatosplenomegaly. Bowel sounds positive.  Musculoskeletal: no clubbing / cyanosis. No joint deformity upper and lower extremities. Good ROM, no contractures. Normal muscle tone.  Skin: no rashes, lesions, ulcers. No induration Neurologic: CN 2-12 grossly intact. Sensation intact, DTR normal. Strength 5/5 in all 4.  Psychiatric: Normal judgment and insight. Alert and oriented x 3. Normal mood.    Labs on Admission: I have personally reviewed following labs and imaging studies  CBC: Recent Labs  Lab 09/15/18 1235  WBC 9.3  HGB 12.7  HCT 40.0  MCV 90.7  PLT 327   Basic Metabolic Panel: Recent Labs  Lab 09/15/18 1235  NA 136  K 3.9  CL 103  CO2 24  GLUCOSE 177*  BUN 15  CREATININE 1.28*  CALCIUM 10.0   GFR: CrCl cannot be calculated (Unknown ideal weight.). Liver Function Tests: No results for input(s): AST, ALT, ALKPHOS, BILITOT, PROT, ALBUMIN in the last 168 hours. No results for input(s): LIPASE, AMYLASE in the last 168 hours. No results for input(s): AMMONIA in the last 168 hours. Coagulation Profile: No  results for input(s): INR, PROTIME in the last 168 hours. Cardiac Enzymes: No results for input(s): CKTOTAL, CKMB, CKMBINDEX, TROPONINI in the last 168 hours. BNP (last 3 results) No results for input(s): PROBNP in the last 8760 hours. HbA1C: No results for input(s): HGBA1C in the last 72 hours. CBG: No results for input(s): GLUCAP in the last 168 hours. Lipid Profile: No results for input(s): CHOL, HDL, LDLCALC, TRIG, CHOLHDL, LDLDIRECT in the last 72 hours. Thyroid Function Tests: No results for input(s): TSH, T4TOTAL, FREET4, T3FREE, THYROIDAB in the last 72 hours. Anemia Panel: No results for input(s): VITAMINB12, FOLATE, FERRITIN, TIBC, IRON, RETICCTPCT in the last 72 hours. Urine analysis:    Component Value Date/Time   COLORURINE YELLOW 01/03/2012 1132   APPEARANCEUR CLEAR 01/03/2012 1132   LABSPEC >1.030 (H)  01/03/2012 1132   PHURINE 6.0 01/03/2012 1132   GLUCOSEU NEG 01/03/2012 1132   HGBUR NEG 01/03/2012 1132   BILIRUBINUR NEG 01/03/2012 1132   KETONESUR NEG 01/03/2012 1132   PROTEINUR NEG 01/03/2012 1132   UROBILINOGEN 0.2 01/03/2012 1132   NITRITE NEG 01/03/2012 1132   LEUKOCYTESUR NEG 01/03/2012 1132    Radiological Exams on Admission: Dg Chest 2 View  Result Date: 09/15/2018 CLINICAL DATA:  Chest palpitations.  Shortness of breath. EXAM: CHEST - 2 VIEW COMPARISON:  September 01, 2014 FINDINGS: The heart size and mediastinal contours are within normal limits. Both lungs are clear. The visualized skeletal structures are unremarkable. IMPRESSION: No active cardiopulmonary disease. Electronically Signed   By: Gerome Sam III M.D   On: 09/15/2018 12:59    EKG: Independently reviewed.  Normal sinus rhythm.  No ST-T wave changes.  QTC is 427.  Assessment/Plan Principal Problem:   Dizziness and giddiness Active Problems:   Elevated cholesterol   Hypertension   Diabetes (HCC)   PSVT (paroxysmal supraventricular tachycardia) (HCC)   Dizziness of unknown  etiology   Hypoxia     1.  Episodic transient dizziness with feeling of slowing of heart not related to posture: Unsure about the cause. Agree with monitoring in telemetry unit given history of PSVT and currently being on metoprolol. Unlikely acute coronary syndrome, will rule out with serial EKG and troponins. If patient gets any events in the hospital, that will be captured in the monitor. If she does not get any events, will need referral for ambulatory monitoring with cardiology. We will check orthostatic blood pressures to rule out orthostatic hypotension. We will check 2D echocardiogram to look for any valvular heart disease.  2.  Hypertension: Fairly stable.  Continue home medications.  3.  Acute kidney injury: Cause unknown.  Will hydrate and monitor levels.  Check orthostatic after hydration.  4.  Type 2 diabetes: On oral hypoglycemics.  Will keep on sliding scale insulin while in the hospital.  5.  Hypoxia: Without symptoms.  D-dimer negative.  No chest pain.  Likely instrumental error.  We will keep monitoring.  Will check ambulatory oxygen before discharge to ensure stabilization.    DVT prophylaxis: Lovenox Code Status: Full code Family Communication: Daughter at the bedside Disposition Plan: Home when is stable Consults called: None. Admission status: Observation   Dorcas Carrow MD [facility] Pager [phone]  If 7PM-7AM, please contact night-coverage [email or web address] Password [facility]  09/15/2018, 5:24 PM

## 2018-09-15 NOTE — Telephone Encounter (Signed)
New Message          *STAT* If patient is at the pharmacy, call can be transferred to refill team.   1. Which medications need to be refilled? (please list name of each medication and dose if known) Metoprolol 25 mg  2. Which pharmacy/location (including street and city if local pharmacy) is medication to be sent to? [facility] [address]  3. Do they need a 30 day or 90 day supply? 90

## 2018-09-15 NOTE — ED Triage Notes (Signed)
Pt presents with chest palpitations and shortness of breath since last night. Speaking in clear sentences, denies n/v/d or fevers. Takes metoprolol.

## 2018-09-15 NOTE — ED Notes (Signed)
Dinner tray ordered.

## 2018-09-15 NOTE — ED Notes (Signed)
Pt denies chest pain at this time.

## 2018-09-15 NOTE — Plan of Care (Signed)

## 2018-09-15 NOTE — ED Notes (Signed)
Pt ambulatory to bathroom. Not in any distress at this time

## 2018-09-15 NOTE — ED Notes (Addendum)
Walked with Pt and her O2 Level dropped from 100% to 67%, notified PA

## 2018-09-15 NOTE — Telephone Encounter (Signed)
Patient was last seen in 2018 and advised to follow up based on stress test results. Ok to refill Metoprolol for a year or does patient need to be seen?

## 2018-09-15 NOTE — ED Notes (Signed)
ED TO INPATIENT HANDOFF REPORT  ED Nurse Name and Phone #: [phone] Zeenat Jeanbaptiste RN  S Name/Age/Gender Candace Baker 68 y.o. female Room/Bed: 041C/041C  Code Status   Code Status: Full Code  Home/SNF/Other Home Patient oriented to: self, place, time and situation Is this baseline? Yes   Triage Complete: Triage complete  Chief Complaint sob,palpitations  Triage Note Pt presents with chest palpitations and shortness of breath since last night. Speaking in clear sentences, denies n/v/d or fevers. Takes metoprolol.    Allergies Allergies  Allergen Reactions  . Tiazac [Diltiazem Hcl] Itching and Rash    Skin peeled    Level of Care/Admitting Diagnosis ED Disposition    ED Disposition Condition Comment   Admit  Hospital Area: [facility] [[id number]]  Level of Care: Cardiac Telemetry [103]  I expect the patient will be discharged within 24 hours: No (not a candidate for 5C-Observation unit)  Diagnosis: Dizziness of unknown etiology [[id number]]  Admitting Physician: Dorcas Carrow [[id number]]  Attending Physician: Dorcas Carrow [[id number]]  PT Class (Do Not Modify): Observation [104]  PT Acc Code (Do Not Modify): Observation [10022]       B Medical/Surgery History Past Medical History:  Diagnosis Date  . Diabetes mellitus   . Elevated cholesterol   . Endometriosis   . Gallstones   . Hypertension   . STD (sexually transmitted disease)    HSV   Past Surgical History:  Procedure Laterality Date  . ABDOMINAL HYSTERECTOMY  1989   TAH   . CHOLECYSTECTOMY    . OOPHORECTOMY  1991   BSO  . TONSILLECTOMY       A IV Location/Drains/Wounds Patient Lines/Drains/Airways Status   Active Line/Drains/Airways    Name:   Placement date:   Placement time:   Site:   Days:   Peripheral IV 09/15/18 Left Antecubital   09/15/18    1400    Antecubital   less than 1          Intake/Output Last 24 hours  Intake/Output Summary (Last 24 hours) at 09/15/2018 1900 Last  data filed at 09/15/2018 1648 Gross per 24 hour  Intake 500 ml  Output -  Net 500 ml    Labs/Imaging Results for orders placed or performed during the hospital encounter of 09/15/18 (from the past 48 hour(s))  Basic metabolic panel     Status: Abnormal   Collection Time: 09/15/18 12:35 PM  Result Value Ref Range   Sodium 136 135 - 145 mmol/L   Potassium 3.9 3.5 - 5.1 mmol/L   Chloride 103 98 - 111 mmol/L   CO2 24 22 - 32 mmol/L   Glucose, Bld 177 (H) 70 - 99 mg/dL   BUN 15 8 - 23 mg/dL   Creatinine, Ser 4.53 (H) 0.44 - 1.00 mg/dL   Calcium 64.6 8.9 - 80.3 mg/dL   GFR calc non Af Amer 43 (L) >60 mL/min   GFR calc Af Amer 49 (L) >60 mL/min   Anion gap 9 5 - 15    Comment: Performed at [facility], [address] [place], [place] [address]  CBC     Status: None   Collection Time: 09/15/18 12:35 PM  Result Value Ref Range   WBC 9.3 4.0 - 10.5 K/uL   RBC 4.41 3.87 - 5.11 MIL/uL   Hemoglobin 12.7 12.0 - 15.0 g/dL   HCT 82.5 00.3 - 70.4 %   MCV 90.7 80.0 - 100.0 fL   MCH 28.8 26.0 -  34.0 pg   MCHC 31.8 30.0 - 36.0 g/dL   RDW 16.1 09.6 - 04.5 %   Platelets 327 150 - 400 K/uL   nRBC 0.0 0.0 - 0.2 %    Comment: Performed at [facility], [address] [place], [place] [address]  I-stat troponin, ED     Status: None   Collection Time: 09/15/18 12:36 PM  Result Value Ref Range   Troponin i, poc 0.00 0.00 - 0.08 ng/mL   Comment 3            Comment: Due to the release kinetics of cTnI, a negative result within the first hours of the onset of symptoms does not rule out myocardial infarction with certainty. If myocardial infarction is still suspected, repeat the test at appropriate intervals.   D-dimer, quantitative (not at [facility])     Status: None   Collection Time: 09/15/18  2:54 PM  Result Value Ref Range   D-Dimer, Quant 0.28 0.00 - 0.50 ug/mL-FEU    Comment: (NOTE) At the manufacturer cut-off of 0.50 ug/mL FEU, this assay has been documented to exclude PE  with a sensitivity and negative predictive value of 97 to 99%.  At this time, this assay has not been approved by the FDA to exclude DVT/VTE. Results should be correlated with clinical presentation. Performed at [facility], [address], [place], [place] [address]   I-Stat Troponin, ED (not at [facility])     Status: None   Collection Time: 09/15/18  5:14 PM  Result Value Ref Range   Troponin i, poc 0.01 0.00 - 0.08 ng/mL   Comment 3            Comment: Due to the release kinetics of cTnI, a negative result within the first hours of the onset of symptoms does not rule out myocardial infarction with certainty. If myocardial infarction is still suspected, repeat the test at appropriate intervals.    Dg Chest 2 View  Result Date: 09/15/2018 CLINICAL DATA:  Chest palpitations.  Shortness of breath. EXAM: CHEST - 2 VIEW COMPARISON:  September 01, 2014 FINDINGS: The heart size and mediastinal contours are within normal limits. Both lungs are clear. The visualized skeletal structures are unremarkable. IMPRESSION: No active cardiopulmonary disease. Electronically Signed   By: Gerome Sam III M.D   On: 09/15/2018 12:59    Pending Labs Unresulted Labs (From admission, onward)    Start     Ordered   09/16/18 0500  Basic metabolic panel  Tomorrow morning,   R     09/15/18 1836   09/15/18 1837  HIV antibody (Routine Testing)  Once,   R     09/15/18 1836          Vitals/Pain Today's Vitals   09/15/18 1815 09/15/18 1830 09/15/18 1845 09/15/18 1900  BP: 114/65 (!) 114/57 120/70   Pulse: (!) 56 (!) 58 63   Resp: Temp:      TempSrc:      SpO2: 100% 100% 99%   PainSc:    0-No pain    Isolation Precautions No active isolations  Medications Medications  aspirin tablet 81 mg (0 mg Oral Hold 09/15/18 1839)  metoprolol succinate (TOPROL-XL) 24 hr tablet 25 mg (has no administration in time range)  rosuvastatin (CRESTOR) tablet 5 mg (has no administration in time range)   irbesartan (AVAPRO) tablet 300 mg (has no administration in time range)  enoxaparin (LOVENOX) injection 40 mg (  has no administration in time range)  acetaminophen (TYLENOL) tablet 650 mg (has no administration in time range)    Or  acetaminophen (TYLENOL) suppository 650 mg (has no administration in time range)  insulin aspart (novoLOG) injection 0-9 Units (has no administration in time range)  insulin aspart (novoLOG) injection 0-5 Units (has no administration in time range)  0.9 %  sodium chloride infusion (has no administration in time range)  sodium chloride flush (NS) 0.9 % injection 3 mL (3 mLs Intravenous Given 09/15/18 1455)  sodium chloride 0.9 % bolus 500 mL (0 mLs Intravenous Stopped 09/15/18 1648)  aspirin chewable tablet 324 mg (324 mg Oral Given 09/15/18 1648)    Mobility walks Low fall risk   Focused Assessments Cardiac Assessment Handoff:    No results found for: CKTOTAL, CKMB, CKMBINDEX, TROPONINI Lab Results  Component Value Date   DDIMER 0.28 09/15/2018   Does the Patient currently have chest pain? No     R Recommendations: See Admitting Provider Note  Report given to:   Additional Notes: Pt alert, oriented, and ambulatory . Pt had CP earlier, received 324 ASA, no current CP. VSS. Pt on room air.

## 2018-09-15 NOTE — ED Provider Notes (Signed)
[facility] Provider Note   CSN: [id number] Arrival date & time: 09/15/18  1221    History   Chief Complaint Chief Complaint  Patient presents with  . Shortness of Breath  . Palpitations    HPI Candace Baker is a 68 y.o. female with Diabetes mellitus, HLD, PSVT, and HTN presenting with intermittent dizziness onset 1 week ago. Patient reports she has not tried anything for her symptoms. Patient states nothing makes symptoms worse. Patient states she had 2 episodes of dizziness at work today and decided to come to the ER to be evaluated. Patient describes dizziness as lightheadedness. Patient reports presyncope, but denies syncope. Patient states her blood pressure medications have changed over the last week. Patient states PCP changed her HCTZ and Telmisartan to just Telmisartan. Patient reports intermittent shortness of breath for 1 month. Patient reports intermittent palpitations for 1 week that last a few minutes and resolve on their own. Patient denies falls or syncope. Patient reports indigestion earlier this morning in central chest and describes intermittent non radiating chest pain as a burning sensation. Patient reports an intermittent cough that is worse with laying down. Patient denies fever, congestion, rhinorrhea, nausea, vomiting, diarrhea, abdominal pain or recent travel. Patient denies numbness, weakness, headaches, or speech difficulty.    HPI  Past Medical History:  Diagnosis Date  . Diabetes mellitus   . Elevated cholesterol   . Endometriosis   . Gallstones   . Hypertension   . STD (sexually transmitted disease)    HSV    Patient Active Problem List   Diagnosis Date Noted  . PSVT (paroxysmal supraventricular tachycardia) (HCC) 02/18/2014  . Elevated cholesterol   . Hypertension   . Diabetes mellitus     Past Surgical History:  Procedure Laterality Date  . ABDOMINAL HYSTERECTOMY  1989   TAH   . CHOLECYSTECTOMY     . OOPHORECTOMY  1991   BSO  . TONSILLECTOMY       OB History    Gravida  2   Para  2   Term  2   Preterm      AB  0   Living  2     SAB      TAB      Ectopic  0   Multiple      Live Births               Home Medications    Prior to Admission medications   Medication Sig Start Date End Date Taking? Authorizing Provider  acyclovir (ZOVIRAX) 400 MG tablet Take one tablet by mouth twice daily for 3-5 days then daily as needed. Patient taking differently: Take 400 mg by mouth daily as needed (outbreaks).  08/11/18  Yes Fontaine, Nadyne Coombesimothy P, MD  aspirin 81 MG tablet Take 81 mg by mouth.     Yes [provider]  JARDIANCE 10 MG TABS tablet Take 1 tablet by mouth daily. 01/26/18  Yes [provider]  metFORMIN (GLUMETZA) 1000 MG (MOD) 24 hr tablet Take 1,000 mg by mouth 2 (two) times daily with a meal.     Yes [provider]  metoprolol succinate (TOPROL-XL) 25 MG 24 hr tablet Take 1 tablet (25 mg total) by mouth daily. Must keep 11/09/2018 appointment with Dr Anne FuSkains for further refills 09/15/18  Yes Jake BatheSkains, Mark C, MD  rosuvastatin (CRESTOR) 5 MG tablet Take 5 mg by mouth daily.   Yes [provider]  telmisartan (MICARDIS)  80 MG tablet Take 80 mg by mouth.     Yes [provider]  hydrochlorothiazide (HYDRODIURIL) 25 MG tablet 1/2 tablet (12.5mg ) by mouth daily Patient not taking: Reported on 09/15/2018 06/18/17   Rosalio Macadamia, NP  valACYclovir (VALTREX) 1000 MG tablet TAKE 1/2 TABLETS (500 MG TOTAL) BY MOUTH DAILY. Patient not taking: Reported on 09/15/2018 02/26/17   Fontaine, Nadyne Coombes, MD    Family History Family History  Problem Relation Age of Onset  . Sarcoidosis Mother   . Hypertension Maternal Grandfather   . Heart disease Paternal Grandfather        NOT INDICATED IF PATERNAL OR MATERNA GF    Social History Social History   Tobacco Use  . Smoking status: Never Smoker  . Smokeless tobacco: Never Used   Substance Use Topics  . Alcohol use: Yes    Alcohol/week: 0.0 standard drinks    Comment: Rare  . Drug use: No     Allergies   Tiazac [diltiazem hcl]   Review of Systems Review of Systems  Constitutional: Negative for chills, diaphoresis, fatigue, fever and unexpected weight change.  HENT: Negative for congestion, rhinorrhea, sore throat and trouble swallowing.   Eyes: Negative for visual disturbance.  Respiratory: Positive for cough and shortness of breath. Negative for chest tightness, wheezing and stridor.   Cardiovascular: Positive for chest pain. Negative for palpitations and leg swelling.  Gastrointestinal: Negative for abdominal pain, nausea and vomiting.  Endocrine: Negative for cold intolerance and heat intolerance.  Skin: Negative for pallor and rash.  Allergic/Immunologic: Negative for environmental allergies and food allergies.  Neurological: Positive for dizziness and light-headedness. Negative for syncope, speech difficulty, weakness, numbness and headaches.  Psychiatric/Behavioral: The patient is not nervous/anxious.     Physical Exam Updated Vital Signs BP 123/61   Pulse (!) 59   Temp 98.2 F (36.8 C) (Oral)   Resp 17   SpO2 100%   Physical Exam Vitals signs and nursing note reviewed.  Constitutional:      General: She is not in acute distress.    Appearance: She is well-developed. She is not diaphoretic.  HENT:     Head: Normocephalic and atraumatic.     Mouth/Throat:     Mouth: Mucous membranes are moist.     Pharynx: No oropharyngeal exudate.  Eyes:     Extraocular Movements: Extraocular movements intact.     Pupils: Pupils are equal, round, and reactive to light.  Neck:     Musculoskeletal: Normal range of motion and neck supple.  Cardiovascular:     Rate and Rhythm: Normal rate and regular rhythm.     Heart sounds: Normal heart sounds. No murmur. No friction rub. No gallop.   Pulmonary:     Effort: Pulmonary effort is normal. No  respiratory distress.     Breath sounds: Normal breath sounds. No decreased breath sounds, wheezing, rhonchi or rales.  Chest:     Chest wall: Tenderness present.  Abdominal:     Palpations: Abdomen is soft.     Tenderness: There is no abdominal tenderness.  Musculoskeletal: Normal range of motion.  Skin:    General: Skin is warm.     Findings: No erythema or rash.  Neurological:     Mental Status: She is alert and oriented to person, place, and time.    Mental Status:  Alert, oriented, thought content appropriate, able to give a coherent history. Speech fluent without evidence of aphasia. Able to follow 2 step commands without difficulty.  Cranial Nerves:  II:  Peripheral visual fields grossly normal, pupils equal, round, reactive to light III,IV, VI: ptosis not present, extra-ocular motions intact bilaterally  V,VII: smile symmetric, facial light touch sensation equal VIII: hearing grossly normal to voice  X: uvula elevates symmetrically  XI: bilateral shoulder shrug symmetric and strong XII: midline tongue extension without fassiculations Motor:  Normal tone. 5/5 in upper and lower extremities bilaterally including strong and equal grip strength and dorsiflexion/plantar flexion Sensory: light touch normal in all extremities.  Deep Tendon Reflexes: 2+ and symmetric in the biceps and patella Cerebellar: normal finger-to-nose with bilateral upper extremities Gait: normal gait and balance.  Negative pronator drift. Negative Romberg sign. CV: distal pulses palpable throughout   ED Treatments / Results  Labs (all labs ordered are listed, but only abnormal results are displayed) Labs Reviewed  BASIC METABOLIC PANEL - Abnormal; Notable for the following components:      Result Value   Glucose, Bld 177 (*)    Creatinine, Ser 1.28 (*)    GFR calc non Af Amer 43 (*)    GFR calc Af Amer 49 (*)    All other components within normal limits  CBC  D-DIMER, QUANTITATIVE (NOT AT [facility])   I-STAT TROPONIN, ED  I-STAT TROPONIN, ED    EKG EKG Interpretation  Date/Time:  Tuesday September 15 2018 12:29:12 EDT Ventricular Rate:  77 PR Interval:  160 QRS Duration: 70 QT Interval:  378 QTC Calculation: 427 R Axis:   47 Text Interpretation:  Normal sinus rhythm Low voltage QRS No significant change since last tracing Confirmed by Gwyneth Sprout ([id number]) on 09/15/2018 1:35:47 PM   Radiology Dg Chest 2 View  Result Date: 09/15/2018 CLINICAL DATA:  Chest palpitations.  Shortness of breath. EXAM: CHEST - 2 VIEW COMPARISON:  September 01, 2014 FINDINGS: The heart size and mediastinal contours are within normal limits. Both lungs are clear. The visualized skeletal structures are unremarkable. IMPRESSION: No active cardiopulmonary disease. Electronically Signed   By: Gerome Sam III M.D   On: 09/15/2018 12:59    Procedures Procedures (including critical care time)  Medications Ordered in ED Medications  sodium chloride flush (NS) 0.9 % injection 3 mL (3 mLs Intravenous Given 09/15/18 1455)  sodium chloride 0.9 % bolus 500 mL (0 mLs Intravenous Stopped 09/15/18 1648)  aspirin chewable tablet 324 mg (324 mg Oral Given 09/15/18 1648)     Initial Impression / Assessment and Plan / ED Course  I have reviewed the triage vital signs and the nursing notes.  Pertinent labs & imaging results that were available during my care of the patient were reviewed by me and considered in my medical decision making (see chart for details).  Clinical Course as of Sep 14 1700  Tue Sep 15, 2018  1346 No active cardiopulmonary disease.  DG Chest 2 View [AH]  1453 Creatinine elevated at 1.28. Will provide IVF.  Creatinine(!): 1.28 [AH]    Clinical Course User Index [AH] Leretha Dykes, PA-C      Patient presents with dizziness, palpitations, chest pain, and shortness of breath. Neurological exam is normal. Heart score is a 5 due to risk factors and age. Oxygen saturation dropped from 100%  to 67% with ambulation. Patient is asymptomatic while laying in bed. Concern for cardiac etiology of Chest Pain. Pt has been re-evaluated prior to consult and VSS, NAD, heart RRR, pain 0/10, lungs CTAB. No acute abnormalities found on EKG and first round of cardiac enzymes negative. This case  was discussed with Dr. Anitra Lauth who has seen the patient and agrees with plan to admit. Consulted hospitalist. Hospitalist has agreed to admit patient.   Final Clinical Impressions(s) / ED Diagnoses   Final diagnoses:  Shortness of breath  Atypical chest pain  Dizziness    ED Discharge Orders    None       Leretha Dykes, PA-C 09/15/18 1706    Gwyneth Sprout, MD 09/18/18 2127

## 2018-09-16 ENCOUNTER — Observation Stay (HOSPITAL_BASED_OUTPATIENT_CLINIC_OR_DEPARTMENT_OTHER): Payer: Medicare Other

## 2018-09-16 DIAGNOSIS — R0602 Shortness of breath: Secondary | ICD-10-CM | POA: Diagnosis not present

## 2018-09-16 DIAGNOSIS — I10 Essential (primary) hypertension: Secondary | ICD-10-CM

## 2018-09-16 DIAGNOSIS — E7800 Pure hypercholesterolemia, unspecified: Secondary | ICD-10-CM

## 2018-09-16 DIAGNOSIS — I471 Supraventricular tachycardia: Secondary | ICD-10-CM

## 2018-09-16 DIAGNOSIS — R0902 Hypoxemia: Secondary | ICD-10-CM

## 2018-09-16 DIAGNOSIS — R0789 Other chest pain: Secondary | ICD-10-CM | POA: Diagnosis not present

## 2018-09-16 DIAGNOSIS — R42 Dizziness and giddiness: Secondary | ICD-10-CM | POA: Diagnosis not present

## 2018-09-16 DIAGNOSIS — E1169 Type 2 diabetes mellitus with other specified complication: Secondary | ICD-10-CM

## 2018-09-16 LAB — BASIC METABOLIC PANEL
Anion gap: 7 (ref 5–15)
BUN: 12 mg/dL (ref 8–23)
CO2: 23 mmol/L (ref 22–32)
Calcium: 8.9 mg/dL (ref 8.9–10.3)
Chloride: 111 mmol/L (ref 98–111)
Creatinine, Ser: 1.11 mg/dL — ABNORMAL HIGH (ref 0.44–1.00)
GFR calc Af Amer: 59 mL/min — ABNORMAL LOW (ref >60)
GFR, EST NON AFRICAN AMERICAN: 51 mL/min — AB (ref >60)
Glucose, Bld: 104 mg/dL — ABNORMAL HIGH (ref 70–99)
Potassium: 4.1 mmol/L (ref 3.5–5.1)
Sodium: 141 mmol/L (ref 135–145)

## 2018-09-16 LAB — GLUCOSE, CAPILLARY
Glucose-Capillary: 96 mg/dL (ref 70–99)
Glucose-Capillary: 137 mg/dL — ABNORMAL HIGH (ref 70–99)

## 2018-09-16 LAB — ECHOCARDIOGRAM COMPLETE
Height: 64 in
Weight: 2382.4 oz

## 2018-09-16 LAB — TSH: TSH: 1.529 u[IU]/mL (ref 0.350–4.500)

## 2018-09-16 LAB — HIV ANTIBODY (ROUTINE TESTING W REFLEX): HIV Screen 4th Generation wRfx: NONREACTIVE

## 2018-09-16 NOTE — Care Management Obs Status (Signed)
MEDICARE OBSERVATION STATUS NOTIFICATION   Patient Details  Name: Candace Baker MRN: [id number] Date of Birth: October 23, 1950   Medicare Observation Status Notification Given:  Yes    Cherrie Distance, RN 09/16/2018, 10:35 AM

## 2018-09-16 NOTE — Progress Notes (Signed)
2D Echocardiogram has been performed.  Candace Baker 09/16/2018, 10:04 AM

## 2018-09-16 NOTE — Progress Notes (Signed)
Pt refusing bed alarm.

## 2018-09-16 NOTE — Care Management Note (Signed)
Case Management Note  Patient Details  Name: Candace Baker MRN: [id number] Date of Birth: 05/30/1951  Subjective/Objective:    Dizziness               Action/Plan: Patient is independent prior to admission; PCP is Dr Catha Gosselin; has private insurance with [facility] with prescription drug coverage. No needs identified at this time. CM will continue to follow for progression of care.  Expected Discharge Date:    possibly 09/17/2018              Expected Discharge Plan:  Home/Self Care  Discharge planning Services  CM Consult   Status of Service:  In process, will continue to follow  Reola Mosher [phone] 09/16/2018, 10:40 AM

## 2018-09-16 NOTE — Progress Notes (Signed)
Candace Baker, is a 68 y.o. female  DOB 11-May-1951  MRN [id number].  Admission date:  09/15/2018  Admitting Physician  Dorcas Carrow, MD  Discharge Date:  09/16/2018   Primary MD  Catha Gosselin, MD  Recommendations for primary care physician for things to follow:   Patient reporting urinary frequency and urine sample never was sent to lab   Discharge Diagnosis   Principal Problem:   Dizziness and giddiness Active Problems:   Elevated cholesterol   Hypertension   Diabetes (HCC)   PSVT (paroxysmal supraventricular tachycardia) (HCC)   Dizziness of unknown etiology   Hypoxia      Past Medical History:  Diagnosis Date  . Diabetes mellitus   . Elevated cholesterol   . Endometriosis   . Gallstones   . Hypertension   . STD (sexually transmitted disease)    HSV    Past Surgical History:  Procedure Laterality Date  . ABDOMINAL HYSTERECTOMY  1989   TAH   . CHOLECYSTECTOMY    . OOPHORECTOMY  1991   BSO  . TONSILLECTOMY         HPI  from the history and physical done on the day of admission:    Candace Baker is a 68 y.o. female with medical history significant of type 2 diabetes on oral hypoglycemics, hyperlipidemia, hypertension and history of paroxysmal SVT previously investigated with Holter monitor and on beta-blockers presents to the emergency room with multiple episodes of dizziness.  According to the patient, she had similar problems in the past when she was diagnosed with PSVT and is on beta-blockers.  Since last 1 week or so she is getting episodic, intermittent, random episodes where she feels dizzy and she feels like her heart slowed down, no related to posture, it may come when she is lying in the bed.  No relation to exertion.  No palpitation.  Today, along with that feeling she also had some  tightness on her lower sternal area.  Each episode last about few seconds with no symptoms in between the events. Denies any cough, cold, flulike symptoms.  Denies any postural dizziness. She does however has fluctuating blood pressures during those events.  She used to be on hydrochlorothiazide and Micadin, patient thinks that symptoms started after she was taken of the hydrochlorothiazide.  ED Course: In the emergency room, she is hemodynamically stable.  Slightly elevated creatinine.  Chest x-ray normal.  Twelve-lead EKG shows low voltage normal sinus rhythm.  No arrhythmias. D-dimer was negative.  Initial troponin and subsequent troponins are negative.  Hospital Course:    1.  Palpitations and dizziness/lightheadedness, history of paroxysmal SVT: Acute.  Patient reports feeling as though she may pass out.  Previous history of proximal SVT for which patient had been on metoprolol for quite some time.  Troponins have remained negative.  Chest x-ray did not show any acute abnormalities.  TSH noted to be within normal limits 1.529.  She was noted to have an increase in her creatinine to improve with IV fluids with concern for dehydration.  On hospital day 2, telemetry overnight noted mild bradycardia with heart rates 60-64 briefly and no other significant arrhythmia.  Echocardiogram noted EF 60 to 65% with normal systolic function and impaired diastolic relaxation.  Symptoms less likely related to cardiac ischemia.  Differential includes dehydration and vagal induced bradycardia versus other.  Patient was referred to be set up to have a Holter monitor.  She was advised to keep yourself adequately hydrated.  2.  Acute renal insufficiency: Baseline creatinine previously noted to be around 1.04 in 2018.  On admission patient's creatinine was elevated up to 1.28.  After IV fluids appear to be trending downward currently 1.11 back to baseline.    3.  Essential hypertension prior to discharge were noted  to be elevated up to 145/67.  Patient was continued on metoprolol and pharmacy substitution for telmisartan during hospitalization.  At discharge she was continued on her current home medications.  4.  Diabetes mellitus type 2: A1c on 5 patient on oral hypoglycemic agents at home of metformin and Jardiance.  Oral agents were held during hospitalization patient was placed on a sliding scale of insulin, but restarted at discharge.  5.  Hyperlipidemia: Stable patient was continued on Crestor.  6.  Hypoxia: Noted on admission, but resolved without significant intervention.  Chest x-ray was otherwise noted to be clear could likely have be clinical error. D-dimer negative at 0.28, and therefore pulmonary embolus was less likely.    Follow UP  Follow-up Information    Little, Caryn Bee, MD. Schedule an appointment as soon as possible for a visit.   Specialty:  Family Medicine Why:  Within 1 to 2 weeks Contact information: [address] [place] [address] [phone]            Consults obtained: None  Discharge Condition: Stable  Diet and Activity recommendation: See Discharge Instructions below   Discharge Instructions    Discharge instructions   Complete by:  As directed    Follow with Primary MD Catha Gosselin, MD within 1 to 2 weeks.  Your symptoms of lightheadedness and palpitations could have likely be related with dehydration.  Cardiology will call to set you up with a Holter in the outpatient setting.  We have not made any changes to your current medication regimen.  Get CBC and BMP-  checked  by Primary MD in 5-7 days ( we routinely change or add medications that can affect your baseline labs and fluid status, therefore we recommend that you get the mentioned basic workup next visit with your PCP, your PCP may decide not to get them or add new tests based on their clinical decision)  Activity: As tolerated   Disposition: Home   Diet: Heart Healthy and carb  modified   Special Instructions: If you have smoked or chewed Tobacco  in the last 2 yrs please stop smoking, stop any regular Alcohol  and or any Recreational drug use.  On your next visit with your primary care physician please Get  Medicines reviewed and adjusted.  Please request your Catha Gosselin, MD to go over all Hospital Tests and Procedure/Radiological results at the follow up, please get all Hospital records sent to your Prim MD by signing hospital release before you go home.  If you experience worsening of your admission symptoms, develop shortness of breath, life threatening emergency, suicidal or homicidal thoughts you must seek medical attention immediately by calling 911 or calling your MD immediately  if symptoms less severe.  You Must read complete instructions/literature along with all the possible adverse reactions/side effects for all the Medicines you take and that have been prescribed to you. Take any new Medicines after you have completely understood and accpet all the possible adverse reactions/side effects.   Do not drive, operate heavy machinery, perform activities at heights, swimming or participation in water activities or provide baby sitting services if your were admitted for syncope or siezures until you have seen by Primary MD or a Neurologist and advised to do so again.  Do not drive when taking Pain medications.  Do not take more than prescribed Pain, Sleep and Anxiety Medications  Wear Seat belts while driving.   Please note  You were cared for by a hospitalist during your hospital stay. If you have any questions about your discharge medications or the care you received while you were in the hospital after you are discharged, you can call the unit and asked to speak with the hospitalist on call if the hospitalist that took care of you is not available. Once you are discharged, your primary care physician will handle any further medical issues. Please note that  NO REFILLS for any discharge medications will be authorized once you are discharged, as it is imperative that you return to your primary care physician (or establish a relationship with a primary care physician if you do not have one) for your aftercare needs so that they can reassess your need for medications and monitor your lab values.        Discharge Medications     Allergies as of 09/16/2018      Reactions   Tiazac [diltiazem Hcl] Itching, Rash   Skin peeled      Medication List    TAKE these medications   acyclovir 400 MG tablet Commonly known as:  ZOVIRAX Take one tablet by mouth twice daily for 3-5 days then daily as needed. What changed:    how much to take  how to take this  when to take this  reasons to take this  additional instructions   aspirin 81 MG tablet Take 81 mg by mouth.   Jardiance 10 MG Tabs tablet Generic drug:  empagliflozin Take 1 tablet by mouth daily.   metFORMIN 1000 MG (MOD) 24 hr tablet Commonly known as:  GLUMETZA Take 1,000 mg by mouth 2 (two) times daily with a meal.   metoprolol succinate 25 MG 24 hr tablet Commonly known as:  TOPROL-XL Take 1 tablet (25 mg total) by mouth daily. Must keep 11/09/2018 appointment with Dr Anne Fu for further refills   rosuvastatin 5 MG tablet Commonly known as:  CRESTOR Take 5 mg by mouth daily.   telmisartan 80 MG tablet Commonly known as:  MICARDIS Take 80 mg by mouth.       Major procedures and Radiology Reports - PLEASE review detailed and final reports for all details, in brief -   Echocardiogram 09/16/2018 1. The left ventricle has normal systolic function with an ejection fraction of 60-65%.  The cavity size was normal. Left ventricular diastolic Doppler parameters are consistent with impaired relaxation.  2. The right ventricle has normal systolic function. The cavity was normal. There is no increase in right ventricular wall thickness.  3. The pericardial effusion is posterior to the  left ventricle.  4. Trivial pericardial effusion is present.  5. The aortic valve is tricuspid.  6. The aortic root and ascending aorta are normal in size and structure.  Dg Chest 2 View  Result Date: 09/15/2018 CLINICAL DATA:  Chest palpitations.  Shortness of breath. EXAM: CHEST - 2 VIEW COMPARISON:  September 01, 2014 FINDINGS: The heart size and mediastinal contours are within normal limits. Both lungs are clear. The visualized skeletal structures are unremarkable. IMPRESSION: No active cardiopulmonary disease. Electronically Signed   By: Gerome Sam III M.D   On: 09/15/2018 12:59    Micro Results   No results found for this or any previous visit (from the past 240 hour(s)).     Today   Subjective    Candace Baker today    Objective   Blood pressure (!) 145/67, pulse 63, temperature 97.9 F (36.6 C), temperature source Oral, resp. rate 20, height 5\' 4"  (1.626 m), weight 67.5 kg, SpO2 100 %.   Intake/Output Summary (Last 24 hours) at 09/17/2018 0634 Last data filed at 09/16/2018 1624 Gross per 24 hour  Intake 600 ml  Output 1200 ml  Net -600 ml    Exam  Constitutional: Elderly female in NAD, calm, comfortable Eyes: PERRL, lids and conjunctivae normal ENMT: Mucous membranes are moist. Posterior pharynx clear of any exudate or lesions.  Neck: normal, supple, no masses, no thyromegaly Respiratory: clear to auscultation bilaterally, no wheezing, no crackles. Normal respiratory effort. No accessory muscle use.  Cardiovascular: Regular rate and rhythm, no murmurs / rubs / gallops. No extremity edema. 2+ pedal pulses. No carotid bruits.  Abdomen: no tenderness, no masses palpated. No hepatosplenomegaly. Bowel sounds positive.  Musculoskeletal: no clubbing / cyanosis. No joint deformity upper and lower extremities. Good ROM, no contractures. Normal muscle tone.  Skin: no rashes, lesions, ulcers. No induration Neurologic: CN 2-12 grossly intact. Sensation intact, DTR  normal. Strength 5/5 in all 4.  Psychiatric: Normal judgment and insight. Alert and oriented x 3. Normal mood.    Data Review   CBC w Diff:  Lab Results  Component Value Date   WBC 9.3 09/15/2018   HGB 12.7 09/15/2018   HCT 40.0 09/15/2018   PLT 327 09/15/2018   LYMPHOPCT 31 04/19/2010   MONOPCT 8 04/19/2010   EOSPCT 2 04/19/2010   BASOPCT 1 04/19/2010    CMP:  Lab Results  Component Value Date   NA 141 09/16/2018   NA 144 07/03/2017   K 4.1 09/16/2018   CL 111 09/16/2018   CO2 23 09/16/2018   BUN 12 09/16/2018   BUN 10 07/03/2017   CREATININE 1.11 (H) 09/16/2018  .   Total Time in preparing paper work, data evaluation and todays exam - 35 minutes  Clydie Braun M.D on 09/17/2018 at 6:34 AM  [facility]   Office  [phone]

## 2018-09-16 NOTE — Progress Notes (Signed)
Pt arrived to unit 3east room 06. Pt is alert and oriented. Pt vitals are stable. Pt skin assessment is complete. Pt in no acute distress. Will continue to monitor pt.

## 2018-09-17 ENCOUNTER — Other Ambulatory Visit: Payer: Self-pay | Admitting: Physician Assistant

## 2018-09-17 DIAGNOSIS — R002 Palpitations: Secondary | ICD-10-CM

## 2018-09-17 DIAGNOSIS — R350 Frequency of micturition: Secondary | ICD-10-CM | POA: Diagnosis present

## 2018-09-17 NOTE — Discharge Summary (Signed)
Candace Baker, is a 68 y.o. female  DOB 11-May-1951  MRN [id number].  Admission date:  09/15/2018  Admitting Physician  Dorcas Carrow, MD  Discharge Date:  09/16/2018   Primary MD  Catha Gosselin, MD  Recommendations for primary care physician for things to follow:   Patient reporting urinary frequency and urine sample never was sent to lab   Discharge Diagnosis   Principal Problem:   Dizziness and giddiness Active Problems:   Elevated cholesterol   Hypertension   Diabetes (HCC)   PSVT (paroxysmal supraventricular tachycardia) (HCC)   Dizziness of unknown etiology   Hypoxia      Past Medical History:  Diagnosis Date  . Diabetes mellitus   . Elevated cholesterol   . Endometriosis   . Gallstones   . Hypertension   . STD (sexually transmitted disease)    HSV    Past Surgical History:  Procedure Laterality Date  . ABDOMINAL HYSTERECTOMY  1989   TAH   . CHOLECYSTECTOMY    . OOPHORECTOMY  1991   BSO  . TONSILLECTOMY         HPI  from the history and physical done on the day of admission:    Candace Baker is a 68 y.o. female with medical history significant of type 2 diabetes on oral hypoglycemics, hyperlipidemia, hypertension and history of paroxysmal SVT previously investigated with Holter monitor and on beta-blockers presents to the emergency room with multiple episodes of dizziness.  According to the patient, she had similar problems in the past when she was diagnosed with PSVT and is on beta-blockers.  Since last 1 week or so she is getting episodic, intermittent, random episodes where she feels dizzy and she feels like her heart slowed down, no related to posture, it may come when she is lying in the bed.  No relation to exertion.  No palpitation.  Today, along with that feeling she also had some  tightness on her lower sternal area.  Each episode last about few seconds with no symptoms in between the events. Denies any cough, cold, flulike symptoms.  Denies any postural dizziness. She does however has fluctuating blood pressures during those events.  She used to be on hydrochlorothiazide and Micadin, patient thinks that symptoms started after she was taken of the hydrochlorothiazide.  ED Course: In the emergency room, she is hemodynamically stable.  Slightly elevated creatinine.  Chest x-ray normal.  Twelve-lead EKG shows low voltage normal sinus rhythm.  No arrhythmias. D-dimer was negative.  Initial troponin and subsequent troponins are negative.  Hospital Course:    1.  Palpitations and dizziness/lightheadedness, history of paroxysmal SVT: Acute.  Patient reports feeling as though she may pass out.  Previous history of proximal SVT for which patient had been on metoprolol for quite some time.  Troponins have remained negative.  Chest x-ray did not show any acute abnormalities.  TSH noted to be within normal limits 1.529.  She was noted to have an increase in her creatinine to improve with IV fluids with concern for dehydration.  On hospital day 2, telemetry overnight noted mild bradycardia with heart rates 60-64 briefly and no other significant arrhythmia.  Echocardiogram noted EF 60 to 65% with normal systolic function and impaired diastolic relaxation.  Symptoms less likely related to cardiac ischemia.  Differential includes dehydration and vagal induced bradycardia versus other.  Patient was referred to be set up to have a Holter monitor.  She was advised to keep yourself adequately hydrated.  2.  Acute renal insufficiency: Baseline creatinine previously noted to be around 1.04 in 2018.  On admission patient's creatinine was elevated up to 1.28.  After IV fluids appear to be trending downward currently 1.11 back to baseline.    3.  Essential hypertension prior to discharge were noted  to be elevated up to 145/67.  Patient was continued on metoprolol and pharmacy substitution for telmisartan during hospitalization.  At discharge she was continued on her current home medications.  4.  Diabetes mellitus type 2: A1c on 5 patient on oral hypoglycemic agents at home of metformin and Jardiance.  Oral agents were held during hospitalization patient was placed on a sliding scale of insulin, but restarted at discharge.  5.  Hyperlipidemia: Stable patient was continued on Crestor.  6.  Hypoxia: Noted on admission, but resolved without significant intervention.  Chest x-ray was otherwise noted to be clear could likely have be clinical error. D-dimer negative at 0.28, and therefore pulmonary embolus was less likely.  7.  Urinary frequency: Acute.  During hospitalization urine sample was reportedly collected, but urinalysis was never performed.  Will need outpatient urinalysis to evaluate for possible UTI.    Follow UP  Follow-up Information    Little, Caryn Bee, MD. Schedule an appointment as soon as possible for a visit.   Specialty:  Family Medicine Why:  Within 1 to 2 weeks Contact information: [address] [place] [address] [phone]            Consults obtained: None  Discharge Condition: Stable  Diet and Activity recommendation: See Discharge Instructions below   Discharge Instructions    Discharge instructions   Complete by:  As directed    Follow with Primary MD Catha Gosselin, MD within 1 to 2 weeks.  Your symptoms of lightheadedness and palpitations could have likely be related with dehydration.  Cardiology will call to set you up with a Holter in the outpatient setting.  We have not made any changes to your current medication regimen.  Get CBC and BMP-  checked  by Primary MD in 5-7 days ( we routinely change or add medications that can affect your baseline labs and fluid status, therefore we recommend that you get the mentioned basic workup next  visit with your PCP, your PCP may decide not to get them or add new tests based on their clinical decision)  Activity: As tolerated   Disposition: Home   Diet: Heart Healthy and carb modified   Special Instructions: If you have smoked or chewed Tobacco  in the  last 2 yrs please stop smoking, stop any regular Alcohol  and or any Recreational drug use.  On your next visit with your primary care physician please Get Medicines reviewed and adjusted.  Please request your Catha Gosselin, MD to go over all Hospital Tests and Procedure/Radiological results at the follow up, please get all Hospital records sent to your Prim MD by signing hospital release before you go home.  If you experience worsening of your admission symptoms, develop shortness of breath, life threatening emergency, suicidal or homicidal thoughts you must seek medical attention immediately by calling 911 or calling your MD immediately  if symptoms less severe.  You Must read complete instructions/literature along with all the possible adverse reactions/side effects for all the Medicines you take and that have been prescribed to you. Take any new Medicines after you have completely understood and accpet all the possible adverse reactions/side effects.   Do not drive, operate heavy machinery, perform activities at heights, swimming or participation in water activities or provide baby sitting services if your were admitted for syncope or siezures until you have seen by Primary MD or a Neurologist and advised to do so again.  Do not drive when taking Pain medications.  Do not take more than prescribed Pain, Sleep and Anxiety Medications  Wear Seat belts while driving.   Please note  You were cared for by a hospitalist during your hospital stay. If you have any questions about your discharge medications or the care you received while you were in the hospital after you are discharged, you can call the unit and asked to speak with the  hospitalist on call if the hospitalist that took care of you is not available. Once you are discharged, your primary care physician will handle any further medical issues. Please note that NO REFILLS for any discharge medications will be authorized once you are discharged, as it is imperative that you return to your primary care physician (or establish a relationship with a primary care physician if you do not have one) for your aftercare needs so that they can reassess your need for medications and monitor your lab values.        Discharge Medications     Allergies as of 09/16/2018      Reactions   Tiazac [diltiazem Hcl] Itching, Rash   Skin peeled      Medication List    TAKE these medications   acyclovir 400 MG tablet Commonly known as:  ZOVIRAX Take one tablet by mouth twice daily for 3-5 days then daily as needed. What changed:    how much to take  how to take this  when to take this  reasons to take this  additional instructions   aspirin 81 MG tablet Take 81 mg by mouth.   Jardiance 10 MG Tabs tablet Generic drug:  empagliflozin Take 1 tablet by mouth daily.   metFORMIN 1000 MG (MOD) 24 hr tablet Commonly known as:  GLUMETZA Take 1,000 mg by mouth 2 (two) times daily with a meal.   metoprolol succinate 25 MG 24 hr tablet Commonly known as:  TOPROL-XL Take 1 tablet (25 mg total) by mouth daily. Must keep 11/09/2018 appointment with Dr Anne Fu for further refills   rosuvastatin 5 MG tablet Commonly known as:  CRESTOR Take 5 mg by mouth daily.   telmisartan 80 MG tablet Commonly known as:  MICARDIS Take 80 mg by mouth.       Major procedures and Radiology Reports - PLEASE  review detailed and final reports for all details, in brief -   Echocardiogram 09/16/2018 1. The left ventricle has normal systolic function with an ejection fraction of 60-65%. The cavity size was normal. Left ventricular diastolic Doppler parameters are consistent with impaired  relaxation.  2. The right ventricle has normal systolic function. The cavity was normal. There is no increase in right ventricular wall thickness.  3. The pericardial effusion is posterior to the left ventricle.  4. Trivial pericardial effusion is present.  5. The aortic valve is tricuspid.  6. The aortic root and ascending aorta are normal in size and structure.  Dg Chest 2 View  Result Date: 09/15/2018 CLINICAL DATA:  Chest palpitations.  Shortness of breath. EXAM: CHEST - 2 VIEW COMPARISON:  September 01, 2014 FINDINGS: The heart size and mediastinal contours are within normal limits. Both lungs are clear. The visualized skeletal structures are unremarkable. IMPRESSION: No active cardiopulmonary disease. Electronically Signed   By: Gerome Sam III M.D   On: 09/15/2018 12:59    Micro Results   No results found for this or any previous visit (from the past 240 hour(s)).     Today   Subjective    Mozelle Remlinger today states that she no longer feels lightheaded with changes in positions.  Denied having any recent fever, chills, nausea, vomiting, abdominal pain, or diarrhea.  Does report some urinary frequency.   Objective   Blood pressure (!) 145/67, pulse 63, temperature 97.9 F (36.6 C), temperature source Oral, resp. rate 20, height  (1.626 m), weight 67.5 kg, SpO2 100 %.   Intake/Output Summary (Last 24 hours) at 09/17/2018 1610 Last data filed at 09/16/2018 1624 Gross per 24 hour  Intake 600 ml  Output 1200 ml  Net -600 ml    Exam  Constitutional: Elderly female in NAD, calm, comfortable Eyes: PERRL, lids and conjunctivae normal ENMT: Mucous membranes are moist. Posterior pharynx clear of any exudate or lesions.  Neck: normal, supple, no masses, no thyromegaly Respiratory: clear to auscultation bilaterally, no wheezing, no crackles. Normal respiratory effort. No accessory muscle use.  Cardiovascular: Regular rate and rhythm, no murmurs / rubs / gallops. No  extremity edema. 2+ pedal pulses. No carotid bruits.  Abdomen: no tenderness, no masses palpated. No hepatosplenomegaly. Bowel sounds positive.  Musculoskeletal: no clubbing / cyanosis. No joint deformity upper and lower extremities. Good ROM, no contractures. Normal muscle tone.  Skin: no rashes, lesions, ulcers. No induration Neurologic: CN 2-12 grossly intact. Sensation intact, DTR normal. Strength 5/5 in all 4.  Psychiatric: Normal judgment and insight. Alert and oriented x 3. Normal mood.    Data Review   CBC w Diff:  Lab Results  Component Value Date   WBC 9.3 09/15/2018   HGB 12.7 09/15/2018   HCT 40.0 09/15/2018   PLT 327 09/15/2018   LYMPHOPCT 31 04/19/2010   MONOPCT 8 04/19/2010   EOSPCT 2 04/19/2010   BASOPCT 1 04/19/2010    CMP:  Lab Results  Component Value Date   NA 141 09/16/2018   NA 144 07/03/2017   K 4.1 09/16/2018   CL 111 09/16/2018   CO2 23 09/16/2018   BUN 12 09/16/2018   BUN 10 07/03/2017   CREATININE 1.11 (H) 09/16/2018  .   Total Time in preparing paper work, data evaluation and todays exam - 35 minutes  Clydie Braun M.D on 09/17/2018 at 6:52 AM  [facility]   Office  [phone]

## 2018-09-24 ENCOUNTER — Telehealth: Payer: Self-pay

## 2018-09-24 NOTE — Telephone Encounter (Signed)
After reviewing patient's chart Dr. Eden Emms recommends patient's GXT and monitor be rescheduled at 2 to 4 weeks from original appointment, due to COVID 19 precautions. [facility] will call patient to reschedule.

## 2018-09-28 NOTE — Telephone Encounter (Signed)
Event monitor resc to 10-15-18.

## 2018-10-14 ENCOUNTER — Telehealth: Payer: Self-pay | Admitting: Radiology

## 2018-10-14 NOTE — Telephone Encounter (Signed)
Enrolled patient in [employer] for a 30 day event monitor to be mailed due to covid-19. Patient was given instructions and knows to expect monitor to arrive in 3-5 days 

## 2018-10-17 ENCOUNTER — Encounter (INDEPENDENT_AMBULATORY_CARE_PROVIDER_SITE_OTHER): Payer: Medicare Other

## 2018-10-17 DIAGNOSIS — R002 Palpitations: Secondary | ICD-10-CM

## 2018-10-29 ENCOUNTER — Telehealth: Payer: Self-pay

## 2018-10-29 NOTE — Telephone Encounter (Signed)
Virtual Visit Pre-Appointment Phone Call  "(Name), I am calling you today to discuss your upcoming appointment. We are currently trying to limit exposure to the virus that causes COVID-19 by seeing patients at home rather than in the office."  1. "What is the BEST phone number to call the day of the visit?" - include this in appointment notes  2. "Do you have or have access to (through a family member/friend) a smartphone with video capability that we can use for your visit?" a. If yes - list this number in appt notes as "cell" (if different from BEST phone #) and list the appointment type as a VIDEO visit in appointment notes b. If no - list the appointment type as a PHONE visit in appointment notes  3. Confirm consent - "In the setting of the current Covid19 crisis, you are scheduled for a (phone or video) visit with your provider on (date) at (time).  Just as we do with many in-office visits, in order for you to participate in this visit, we must obtain consent.  If you'd like, I can send this to your mychart (if signed up) or email for you to review.  Otherwise, I can obtain your verbal consent now.  All virtual visits are billed to your insurance company just like a normal visit would be.  By agreeing to a virtual visit, we'd like you to understand that the technology does not allow for your provider to perform an examination, and thus may limit your provider's ability to fully assess your condition. If your provider identifies any concerns that need to be evaluated in person, we will make arrangements to do so.  Finally, though the technology is pretty good, we cannot assure that it will always work on either your or our end, and in the setting of a video visit, we may have to convert it to a phone-only visit.  In either situation, we cannot ensure that we have a secure connection.  Are you willing to proceed?" STAFF: Did the patient verbally acknowledge consent to telehealth visit? YES  4.  Advise patient to be prepared - "Two hours prior to your appointment, go ahead and check your blood pressure, pulse, oxygen saturation, and your weight (if you have the equipment to check those) and write them all down. When your visit starts, your provider will ask you for this information. If you have an Apple Watch or Kardia device, please plan to have heart rate information ready on the day of your appointment. Please have a pen and paper handy nearby the day of the visit as well."  5. Give patient instructions for MyChart download to smartphone OR [email or web address] as below if video visit (depending on what platform provider is using)  6. Inform patient they will receive a phone call 15 minutes prior to their appointment time (may be from unknown caller ID) so they should be prepared to answer    TELEPHONE CALL NOTE  Candace Baker has been deemed a candidate for a follow-up tele-health visit to limit community exposure during the Covid-19 pandemic. I spoke with the patient via phone to ensure availability of phone/video source, confirm preferred email & phone number, and discuss instructions and expectations.  I reminded Candace Baker to be prepared with any vital sign and/or heart rhythm information that could potentially be obtained via home monitoring, at the time of her visit. I reminded Candace Baker to expect a phone call prior to her visit.  Perrin Smack, [place] 10/29/2018 4:20 PM   INSTRUCTIONS FOR DOWNLOADING THE MYCHART APP TO SMARTPHONE  - The patient must first make sure to have activated MyChart and know their login information - If Apple, go to [employer] and type in MyChart in the search bar and download the app. If Android, ask patient to go to [employer] and type in [place] in the search bar and download the app. The app is free but as with any other app downloads, their phone may require them to verify saved payment information or Apple/Android  password.  - The patient will need to then log into the app with their MyChart username and password, and select [facility] as their healthcare provider to link the account. When it is time for your visit, go to the MyChart app, find appointments, and click Begin Video Visit. Be sure to Select Allow for your device to access the Microphone and Camera for your visit. You will then be connected, and your provider will be with you shortly.  **If they have any issues connecting, or need assistance please contact MyChart service desk [phone])**  **If using a computer, in order to ensure the best quality for their visit they will need to use either of the following Internet Browsers: [employer] or Google Chrome**  IF USING DOXIMITY or DOXY.ME - The patient will receive a link just prior to their visit by text.     FULL LENGTH CONSENT FOR TELE-HEALTH VISIT   I hereby voluntarily request, consent and authorize [facility] and its employed or contracted physicians, physician assistants, nurse practitioners or other licensed health care professionals (the Practitioner), to provide me with telemedicine health care services (the "Services") as deemed necessary by the treating Practitioner. I acknowledge and consent to receive the Services by the Practitioner via telemedicine. I understand that the telemedicine visit will involve communicating with the Practitioner through live audiovisual communication technology and the disclosure of certain medical information by electronic transmission. I acknowledge that I have been given the opportunity to request an in-person assessment or other available alternative prior to the telemedicine visit and am voluntarily participating in the telemedicine visit.  I understand that I have the right to withhold or withdraw my consent to the use of telemedicine in the course of my care at any time, without affecting my right to future care or treatment,  and that the Practitioner or I may terminate the telemedicine visit at any time. I understand that I have the right to inspect all information obtained and/or recorded in the course of the telemedicine visit and may receive copies of available information for a reasonable fee.  I understand that some of the potential risks of receiving the Services via telemedicine include:  Marland Kitchen Delay or interruption in medical evaluation due to technological equipment failure or disruption; . Information transmitted may not be sufficient (e.g. poor resolution of images) to allow for appropriate medical decision making by the Practitioner; and/or  . In rare instances, security protocols could fail, causing a breach of personal health information.  Furthermore, I acknowledge that it is my responsibility to provide information about my medical history, conditions and care that is complete and accurate to the best of my ability. I acknowledge that Practitioner's advice, recommendations, and/or decision may be based on factors not within their control, such as incomplete or inaccurate data provided by me or distortions of diagnostic images or specimens that may result from electronic transmissions. I understand  that the practice of medicine is not an exact science and that Practitioner makes no warranties or guarantees regarding treatment outcomes. I acknowledge that I will receive a copy of this consent concurrently upon execution via email to the email address I last provided but may also request a printed copy by calling the office of [place].    I understand that my insurance will be billed for this visit.   I have read or had this consent read to me. . I understand the contents of this consent, which adequately explains the benefits and risks of the Services being provided via telemedicine.  . I have been provided ample opportunity to ask questions regarding this consent and the Services and have had my questions  answered to my satisfaction. . I give my informed consent for the services to be provided through the use of telemedicine in my medical care  By participating in this telemedicine visit I agree to the above.

## 2018-11-09 ENCOUNTER — Ambulatory Visit: Payer: Medicare Other | Admitting: Cardiology

## 2018-11-16 ENCOUNTER — Other Ambulatory Visit: Payer: Self-pay

## 2018-11-18 ENCOUNTER — Other Ambulatory Visit: Payer: Self-pay

## 2018-11-18 ENCOUNTER — Telehealth (INDEPENDENT_AMBULATORY_CARE_PROVIDER_SITE_OTHER): Payer: Medicare Other | Admitting: Cardiology

## 2018-11-18 ENCOUNTER — Encounter: Payer: Self-pay | Admitting: Cardiology

## 2018-11-18 VITALS — BP 147/64 | HR 69 | Ht 64.0 in | Wt 150.0 lb

## 2018-11-18 DIAGNOSIS — R002 Palpitations: Secondary | ICD-10-CM

## 2018-11-18 DIAGNOSIS — I493 Ventricular premature depolarization: Secondary | ICD-10-CM

## 2018-11-18 DIAGNOSIS — I10 Essential (primary) hypertension: Secondary | ICD-10-CM

## 2018-11-18 DIAGNOSIS — E119 Type 2 diabetes mellitus without complications: Secondary | ICD-10-CM

## 2018-11-18 DIAGNOSIS — E782 Mixed hyperlipidemia: Secondary | ICD-10-CM

## 2018-11-18 DIAGNOSIS — R9431 Abnormal electrocardiogram [ECG] [EKG]: Secondary | ICD-10-CM

## 2018-11-18 MED ORDER — METOPROLOL SUCCINATE ER 25 MG PO TB24: 25.0000 mg | ORAL_TABLET | Freq: Every day | ORAL | 3 refills | Status: DC

## 2018-11-18 NOTE — Patient Instructions (Signed)
  Medication Instructions:  The current medical regimen is effective;  continue present plan and medications.  If you need a refill on your cardiac medications before your next appointment, please call your pharmacy.   Follow-Up: Follow up in 1 year with Dr. Skains.  You will receive a letter in the mail 2 months before you are due.  Please call us when you receive this letter to schedule your follow up appointment.  Thank you for choosing [facility]!!     

## 2018-11-18 NOTE — Progress Notes (Signed)
Virtual Visit via Video Note   This visit type was conducted due to national recommendations for restrictions regarding the COVID-19 Pandemic (e.g. social distancing) in an effort to limit this patient's exposure and mitigate transmission in our community.  Due to her co-morbid illnesses, this patient is at least at moderate risk for complications without adequate follow up.  This format is felt to be most appropriate for this patient at this time.  All issues noted in this document were discussed and addressed.  A limited physical exam was performed with this format.  Please refer to the patient's chart for her consent to telehealth [facility].   Date:  11/18/2018   ID:  Candace EvenerSelena H Baker, DOB September 02, 1950, MRN [id number]  Patient Location: Home Provider Location: Home  PCP:  Catha GosselinLittle, Kevin, MD  Cardiologist:  Donato SchultzMark Skains, MD  Electrophysiologist:  None   Evaluation Performed:  Follow-Up Visit  Chief Complaint: Follow-up dizziness, monitor  History of Present Illness:    Candace IsaacsSelena H Baker is a 68 y.o. 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  Intermittent random bouts of dizziness feels her heart rate slowing down does not seem to be related to posture.  Thought that maybe her symptoms were related to being off of the HCTZ.  EKG showed normal sinus rhythm, d-dimer was negative, troponins were negative. TSH was normal.  She did have some increase in her creatinine and there was some concerns about dehydration.  Telemetry was unremarkable in the hospital setting.  Echocardiogram demonstrated EF 60 to 65% with grade 1 diastolic dysfunction.  It was thought that her symptoms included dehydration and perhaps vagal induced symptoms.  Event monitor showed the following:  Occasional PVCs/PACs noted. These were often accompanied with sensation of flutters  Sinus rhythm predominant rhythm, sinus tachycardia occasionally. No  adverse arrhythmias.  No pauses  No atrial fibrillation  No ventricular tachycardia  No evidence of supraventricular tachycardia   Overall reassuring monitor.  Symptoms correlate with occasional PVCs or PACs.  Benign.  Continue with medical management.  Reassurance.  Discussed at clinic visit.  Overall she is doing very well.  She does recall that her blood pressure got a little bit lower than usual which precipitated her hospitalization.  HCTZ was stopped.  She is not really feeling much in the line of palpitations currently.  No chest pain, no shortness of breath.  No syncope.  No fevers.  The patient does not have symptoms concerning for COVID-19 infection (fever, chills, cough, or new shortness of breath).    Past Medical History:  Diagnosis Date  . Diabetes mellitus   . Elevated cholesterol   . Endometriosis   . Gallstones   . Hypertension   . STD (sexually transmitted disease)    HSV   Past Surgical History:  Procedure Laterality Date  . ABDOMINAL HYSTERECTOMY  1989   TAH   . CHOLECYSTECTOMY    . OOPHORECTOMY  1991   BSO  . TONSILLECTOMY       Current Meds  Medication Sig  . acyclovir (ZOVIRAX) 400 MG tablet Take 400 mg by mouth as needed.  Marland Kitchen. aspirin 81 MG tablet Take 81 mg by mouth.    Marland Kitchen. glipiZIDE (GLUCOTROL XL) 5 MG 24 hr tablet Take 5 mg by mouth daily.  Marland Kitchen. JARDIANCE 10 MG TABS tablet Take 1 tablet by mouth daily.  . metFORMIN (GLUMETZA) 1000 MG (MOD) 24 hr tablet Take 1,000 mg by mouth  2 (two) times daily with a meal.    . metoprolol succinate (TOPROL-XL) 25 MG 24 hr tablet Take 1 tablet (25 mg total) by mouth daily.  . rosuvastatin (CRESTOR) 5 MG tablet Take 5 mg by mouth daily.  Marland Kitchen telmisartan (MICARDIS) 80 MG tablet Take 80 mg by mouth.    . [DISCONTINUED] metoprolol succinate (TOPROL-XL) 25 MG 24 hr tablet Take 1 tablet (25 mg total) by mouth daily. Must keep 11/09/2018 appointment with Dr Anne Fu for further refills     Allergies:   Tiazac [diltiazem hcl]    Social History   Tobacco Use  . Smoking status: Never Smoker  . Smokeless tobacco: Never Used  Substance Use Topics  . Alcohol use: Yes    Alcohol/week: 0.0 standard drinks    Comment: Rare  . Drug use: No     Family Hx: The patient's family history includes Heart disease in her paternal grandfather; Hypertension in her maternal grandfather; Sarcoidosis in her mother.  ROS:   Please see the history of present illness.     All other systems reviewed and are negative.   Prior CV studies:   The following studies were reviewed today:  Nuclear stress test 06/11/2017: -Low risk no ischemia  Echocardiogram 09/2018: - EF 65%, grade 1 diastolic dysfunction  Labs/Other Tests and Data Reviewed:    EKG:    Recent Labs: 09/15/2018: Hemoglobin 12.7; Platelets 327 09/16/2018: BUN 12; Creatinine, Ser 1.11; Potassium 4.1; Sodium 141; TSH 1.529   Recent Lipid Panel No results found for: CHOL, TRIG, HDL, CHOLHDL, LDLCALC, LDLDIRECT  Wt Readings from Last 3 Encounters:  11/18/18 150 lb (68 kg)  09/16/18 148 lb 14.4 oz (67.5 kg)  02/03/18 155 lb (70.3 kg)     Objective:    Vital Signs:  BP (!) 147/64   Pulse 69   Ht 5\' 4"  (1.626 m)   Wt 150 lb (68 kg)   BMI 25.75 kg/m    VITAL SIGNS:  reviewed GEN:  no acute distress EYES:  sclerae anicteric, EOMI - Extraocular Movements Intact RESPIRATORY:  normal respiratory effort, symmetric expansion SKIN:  no rash, lesions or ulcers. MUSCULOSKELETAL:  no obvious deformities. NEURO:  alert and oriented x 3, no obvious focal deficit PSYCH:  normal affect  ASSESSMENT & PLAN:    Palpitations/PVCs/PACs - Continue with Toprol. - Event monitor reassuring with only PVCs and PACs.  Benign.  Discussed with her.  No evidence of PSVT. -Normal EF 65%.  Diabetes with hypertension -Medications reviewed.  She is off of HCTZ.  She is doing well.  Blood pressure under good control.  Hyperlipidemia - Crestor 5 mg.  Excellent.  Tolerating well.   No myalgias.  COVID-19 Education: The signs and symptoms of COVID-19 were discussed with the patient and how to seek care for testing (follow up with PCP or arrange E-visit).  The importance of social distancing was discussed today.  Time:   Today, I have spent 8 minutes with the patient with telehealth technology discussing the above problems.     Medication Adjustments/Labs and Tests Ordered: Current medicines are reviewed at length with the patient today.  Concerns regarding medicines are outlined above.   Tests Ordered: No orders of the defined types were placed in this encounter.   Medication Changes: Meds ordered this encounter  Medications  . metoprolol succinate (TOPROL-XL) 25 MG 24 hr tablet    Sig: Take 1 tablet (25 mg total) by mouth daily.    Dispense:  90 tablet  Refill:  3    Disposition:  Follow up in 1 year(s)  Signed, Donato Schultz, MD  11/18/2018 11:53 AM    [facility]

## 2018-11-26 ENCOUNTER — Encounter: Payer: Self-pay | Admitting: Gynecology

## 2019-02-05 ENCOUNTER — Other Ambulatory Visit: Payer: Self-pay | Admitting: Gynecology

## 2019-02-22 ENCOUNTER — Telehealth: Payer: Self-pay | Admitting: Cardiology

## 2019-02-22 NOTE — Telephone Encounter (Signed)
Noted - will look for disc tomorrow while in office.

## 2019-02-22 NOTE — Telephone Encounter (Signed)
New message   Patient states that she will be dropping off a copy of disk from [place] for Dr. Marlou Porch to review. Patient states that it shows some calcium buildup. She states that she will drop disk off before 5:00 pm today. Please call the patient after disk is reviewed.

## 2019-02-23 NOTE — Telephone Encounter (Signed)
Disc received and given to Dr Marlou Porch for review.

## 2019-03-02 NOTE — Telephone Encounter (Signed)
Patient is calling to find out is Dr. Marlou Porch has had a change to revies disc yet.

## 2019-03-02 NOTE — Telephone Encounter (Signed)
I spoke to the patient and informed her that I would reach out to Dr Marlou Porch to see if he has reviewed the disc.  She verbalized understanding and thanked me for the call.

## 2019-03-09 NOTE — Telephone Encounter (Signed)
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 This is seen as well on CT scan you had in 2019.   Treatment for this is diet, exercise and statin therapy, as well as ASA 81.  You are taking Crestor 5mg  PO QD.   It would be optimal for your LDL to be less than 70.  Dr. Hulan Fess has been checking this as your PCP.   Thanks.   Candee Furbish, MD

## 2019-03-09 NOTE — Telephone Encounter (Signed)
Called and spoke with patient regarding the results reviewed by Dr Marlou Porch from the CT scan she brought to the office.  Pt is aware of Dr Marlou Porch remarks and review.   She is aware to continue on her current medications and treatments.  Aware LDL goal is 70 or less and she does verify her lipids are being followed by Dr Rex Kras.  All questions answered and she expressed gratitude for the c/b and information.

## 2019-04-14 ENCOUNTER — Encounter: Payer: Self-pay | Admitting: Gynecology

## 2019-06-01 ENCOUNTER — Telehealth: Payer: Self-pay | Admitting: Cardiology

## 2019-06-01 DIAGNOSIS — R9431 Abnormal electrocardiogram [ECG] [EKG]: Secondary | ICD-10-CM

## 2019-06-01 MED ORDER — METOPROLOL SUCCINATE ER 25 MG PO TB24: 25.0000 mg | ORAL_TABLET | Freq: Every day | ORAL | 1 refills | Status: DC

## 2019-06-01 NOTE — Telephone Encounter (Signed)
°*  STAT* If patient is at the pharmacy, call can be transferred to refill team.   1. Which medications need to be refilled? (please list name of each medication and dose if known) metoprolol succinate (TOPROL-XL) 25 MG 24 hr tablet  2. Which pharmacy/location (including street and city if local pharmacy) is medication to be sent to? [facility]  3. Do they need a 30 day or 90 day supply? [place]

## 2019-06-01 NOTE — Telephone Encounter (Signed)
Pt's medication was sent to pt's pharmacy as requested. Confirmation received.  °

## 2019-11-01 ENCOUNTER — Telehealth: Payer: Self-pay | Admitting: *Deleted

## 2019-11-01 MED ORDER — ACYCLOVIR 400 MG PO TABS: ORAL_TABLET | ORAL | 0 refills | Status: DC

## 2019-11-01 NOTE — Telephone Encounter (Signed)
Patient called requesting on acyclovir 400 mg tablet, annual exam scheduled 11/09/19. Rx sent.

## 2019-11-09 ENCOUNTER — Encounter: Payer: Medicare Other | Admitting: Nurse Practitioner

## 2019-11-27 ENCOUNTER — Other Ambulatory Visit: Payer: Self-pay | Admitting: Cardiology

## 2019-11-27 DIAGNOSIS — R9431 Abnormal electrocardiogram [ECG] [EKG]: Secondary | ICD-10-CM

## 2019-11-29 ENCOUNTER — Other Ambulatory Visit: Payer: Self-pay

## 2019-12-01 ENCOUNTER — Encounter: Payer: Self-pay | Admitting: Nurse Practitioner

## 2019-12-01 ENCOUNTER — Other Ambulatory Visit: Payer: Self-pay

## 2019-12-01 ENCOUNTER — Ambulatory Visit: Payer: Medicare Other | Admitting: Nurse Practitioner

## 2019-12-01 VITALS — BP 130/80 | Ht 64.0 in | Wt 158.0 lb

## 2019-12-01 DIAGNOSIS — Z90722 Acquired absence of ovaries, bilateral: Secondary | ICD-10-CM

## 2019-12-01 DIAGNOSIS — Z90710 Acquired absence of both cervix and uterus: Secondary | ICD-10-CM | POA: Diagnosis not present

## 2019-12-01 DIAGNOSIS — Z01419 Encounter for gynecological examination (general) (routine) without abnormal findings: Secondary | ICD-10-CM

## 2019-12-01 DIAGNOSIS — Z9079 Acquired absence of other genital organ(s): Secondary | ICD-10-CM

## 2019-12-01 NOTE — Progress Notes (Signed)
   Candace Baker [birth year] [id number]   History:  69 y.o. G2 P2 presents for annual exam without GYN complaints. 1989 TAH BSO due to endometriosis. No HRT, no bleeding. Normal pap and mammogram history. No longer performing cervical cancer screenings per patient request. History of HTN, DM - managed by PCP. Daughter is a breast cancer survivor.   Gynecologic History No LMP recorded. Patient has had a hysterectomy.   Last Pap: 2012. Results were: normal Last mammogram: 11/26/2018. Results were: normal Last colonoscopy: 2019. Results were: normal Last Dexa: 11/04/2016. Results were: normal, 5 year repeat  Past medical history, past surgical history, family history and social history were all reviewed and documented in the EPIC chart.  ROS:  A ROS was performed and pertinent positives and negatives are included.  Exam:  Vitals:   11/30/69 0904  Weight: 158 lb (71.7 kg)  Height: 5\' 4"  (1.626 m)   Body mass index is 27.12 kg/m.  General appearance:  Normal Thyroid:  Symmetrical, normal in size, without palpable masses or nodularity. Respiratory  Auscultation:  Clear without wheezing or rhonchi Cardiovascular  Auscultation:  Regular rate, without rubs, murmurs or gallops  Edema/varicosities:  Not grossly evident Abdominal  Soft,nontender, without masses, guarding or rebound.  Liver/spleen:  No organomegaly noted  Hernia:  None appreciated  Skin  Inspection:  Grossly normal   Breasts: Examined lying and sitting.   Right: Without masses, retractions, discharge or axillary adenopathy.   Left: Without masses, retractions, discharge or axillary adenopathy. Gentitourinary   Inguinal/mons:  Normal without inguinal adenopathy  External genitalia:  Normal  BUS/Urethra/Skene's glands:  Normal  Vagina:  Normal, atrophic changes  Cervix: absent  Uterus:  absent  Adnexa/parametria:     Rt: Without masses or tenderness.   Lt: Without masses or tenderness.  Anus and  perineum: Normal  Digital rectal exam: Normal sphincter tone without palpated masses or tenderness  Assessment/Plan:  69 y.o.  for annual exam.  Well female exam with routine gynecological exam - Education provided on SBEs, importance of preventative screenings, current guidelines, high calcium diet, regular exercise, and multivitamin daily. Labs done with PCP.  Breast cancer screening - mammogram scheduled for next month. Breast exam normal today  History of total abdominal hysterectomy and bilateral salpingo-oophorectomy - No HRT, no bleeding  Cervical cancer screening - No longer performing cervical cancer screenings per patient request. Normal pap history.  Follow up in 1 year for annual      [address] [facility], 9:07 AM 12/01/2019

## 2019-12-01 NOTE — Patient Instructions (Signed)
Health Maintenance After Age 71 After age 71, you are at a higher risk for certain long-term diseases and infections as well as injuries from falls. Falls are a major cause of broken bones and head injuries in people who are older than age 71. Getting regular preventive care can help to keep you healthy and well. Preventive care includes getting regular testing and making lifestyle changes as recommended by your health care provider. Talk with your health care provider about:  Which screenings and tests you should have. A screening is a test that checks for a disease when you have no symptoms.  A diet and exercise plan that is right for you. What should I know about screenings and tests to prevent falls? Screening and testing are the best ways to find a health problem early. Early diagnosis and treatment give you the best chance of managing medical conditions that are common after age 71. Certain conditions and lifestyle choices may make you more likely to have a fall. Your health care provider may recommend:  Regular vision checks. Poor vision and conditions such as cataracts can make you more likely to have a fall. If you wear glasses, make sure to get your prescription updated if your vision changes.  Medicine review. Work with your health care provider to regularly review all of the medicines you are taking, including over-the-counter medicines. Ask your health care provider about any side effects that may make you more likely to have a fall. Tell your health care provider if any medicines that you take make you feel dizzy or sleepy.  Osteoporosis screening. Osteoporosis is a condition that causes the bones to get weaker. This can make the bones weak and cause them to break more easily.  Blood pressure screening. Blood pressure changes and medicines to control blood pressure can make you feel dizzy.  Strength and balance checks. Your health care provider may recommend certain tests to check your  strength and balance while standing, walking, or changing positions.  Foot health exam. Foot pain and numbness, as well as not wearing proper footwear, can make you more likely to have a fall.  Depression screening. You may be more likely to have a fall if you have a fear of falling, feel emotionally low, or feel unable to do activities that you used to do.  Alcohol use screening. Using too much alcohol can affect your balance and may make you more likely to have a fall. What actions can I take to lower my risk of falls? General instructions  Talk with your health care provider about your risks for falling. Tell your health care provider if: ? You fall. Be sure to tell your health care provider about all falls, even ones that seem minor. ? You feel dizzy, sleepy, or off-balance.  Take over-the-counter and prescription medicines only as told by your health care provider. These include any supplements.  Eat a healthy diet and maintain a healthy weight. A healthy diet includes low-fat dairy products, low-fat (lean) meats, and fiber from whole grains, beans, and lots of fruits and vegetables. Home safety  Remove any tripping hazards, such as rugs, cords, and clutter.  Install safety equipment such as grab bars in bathrooms and safety rails on stairs.  Keep rooms and walkways well-lit. Activity   Follow a regular exercise program to stay fit. This will help you maintain your balance. Ask your health care provider what types of exercise are appropriate for you.  If you need a cane or   walker, use it as recommended by your health care provider.  Wear supportive shoes that have nonskid soles. Lifestyle  Do not drink alcohol if your health care provider tells you not to drink.  If you drink alcohol, limit how much you have: ? 0-1 drink a day for women. ? 0-2 drinks a day for men.  Be aware of how much alcohol is in your drink. In the [place] one drink equals one typical bottle of beer (12  oz), one-half glass of wine (5 oz), or one shot of hard liquor (1 oz).  Do not use any products that contain nicotine or tobacco, such as cigarettes and e-cigarettes. If you need help quitting, ask your health care provider. Summary  Having a healthy lifestyle and getting preventive care can help to protect your health and wellness after age 71.  Screening and testing are the best way to find a health problem early and help you avoid having a fall. Early diagnosis and treatment give you the best chance for managing medical conditions that are more common for people who are older than age 71.  Falls are a major cause of broken bones and head injuries in people who are older than age 71. Take precautions to prevent a fall at home.  Work with your health care provider to learn what changes you can make to improve your health and wellness and to prevent falls. This information is not intended to replace advice given to you by your health care provider. Make sure you discuss any questions you have with your health care provider. Document Revised: 10/15/2018 Document Reviewed: 05/07/2017 Elsevier Patient Education  2020 [employer]  

## 2019-12-08 ENCOUNTER — Encounter: Payer: Self-pay | Admitting: Cardiology

## 2019-12-08 ENCOUNTER — Other Ambulatory Visit: Payer: Self-pay

## 2019-12-08 ENCOUNTER — Ambulatory Visit: Payer: Medicare Other | Admitting: Cardiology

## 2019-12-08 VITALS — BP 160/80 | HR 69 | Ht 64.0 in | Wt 159.0 lb

## 2019-12-08 DIAGNOSIS — I10 Essential (primary) hypertension: Secondary | ICD-10-CM

## 2019-12-08 DIAGNOSIS — R002 Palpitations: Secondary | ICD-10-CM

## 2019-12-08 DIAGNOSIS — I700 Atherosclerosis of aorta: Secondary | ICD-10-CM

## 2019-12-08 DIAGNOSIS — E119 Type 2 diabetes mellitus without complications: Secondary | ICD-10-CM | POA: Diagnosis not present

## 2019-12-08 DIAGNOSIS — R9431 Abnormal electrocardiogram [ECG] [EKG]: Secondary | ICD-10-CM

## 2019-12-08 MED ORDER — METOPROLOL SUCCINATE ER 25 MG PO TB24: 25.0000 mg | ORAL_TABLET | Freq: Every day | ORAL | 3 refills | Status: DC

## 2019-12-08 NOTE — Patient Instructions (Addendum)
Medication Instructions:  The current medical regimen is effective;  continue present plan and medications.  *If you need a refill on your cardiac medications before your next appointment, please call your pharmacy*  Follow-Up: At [facility], you and your health needs are our priority.  As part of our continuing mission to provide you with exceptional heart care, we have created designated Provider Care Teams.  These Care Teams include your primary Cardiologist (physician) and Advanced Practice Providers (APPs -  Physician Assistants and Nurse Practitioners) who all work together to provide you with the care you need, when you need it.  We recommend signing up for the patient portal called "MyChart".  Sign up information is provided on this After Visit Summary.  MyChart is used to connect with patients for Virtual Visits (Telemedicine).  Patients are able to view lab/test results, encounter notes, upcoming appointments, etc.  Non-urgent messages can be sent to your provider as well.   To learn more about what you can do with MyChart, go to [email or web address].    Your next appointment:   12 month(s)  The format for your next appointment:   In Person  Provider:   Donato Schultz, MD   Please keep a check on your blood pressure and send Dr Anne Fu a log of them in about 1 month.  You can either send this through MyChart, fax a copy [phone]) or phone them in.

## 2019-12-08 NOTE — Progress Notes (Signed)
Cardiology Office Note:    Date:  12/08/2019   ID:  Candace, Baker [birth year], MRN [id number]  PCP:  Candace Gosselin, MD  Cardiologist:  Candace Schultz, MD  Electrophysiologist:  None   Referring MD: Candace Gosselin, MD     History of Present Illness:    Candace Baker is a 69 y.o. female here for the follow up of PSVT, aortic atherosclerosis seen on CT, prior dizziness.   From prior note:  Intermittent random bouts of dizziness feels her heart rate slowing down does not seem to be related to posture.  Thought that maybe her symptoms were related to being off of the HCTZ.  EKG showed normal sinus rhythm, d-dimer was negative, troponins were negative. TSH was normal.  She did have some increase in her creatinine and there was some concerns about dehydration.  Telemetry was unremarkable in the hospital setting.  Echocardiogram demonstrated EF 60 to 65% with grade 1 diastolic dysfunction.  It was thought that her symptoms included dehydration and perhaps vagal induced symptoms.  Event monitor showed the following:  Occasional PVCs/PACs noted. These were often accompanied with sensation of flutters  Sinus rhythm predominant rhythm, sinus tachycardia occasionally. No adverse arrhythmias.  No pauses  No atrial fibrillation  No ventricular tachycardia  No evidence of supraventricular tachycardia  Overall reassuring monitor. Symptoms correlate with occasional PVCs or PACs. Benign. Continue with medical management. Reassurance. Discussed at clinic visit.   Past Medical History:  Diagnosis Date  . Diabetes mellitus   . Elevated cholesterol   . Endometriosis   . Gallstones   . Hypertension   . STD (sexually transmitted disease)    HSV    Past Surgical History:  Procedure Laterality Date  . ABDOMINAL HYSTERECTOMY  1989   TAH   . CHOLECYSTECTOMY    . OOPHORECTOMY  1991   BSO  . TONSILLECTOMY      Current Medications: Current Meds  Medication Sig  . acyclovir  (ZOVIRAX) 400 MG tablet TAKE ONE TABLET BY MOUTH TWICE DAILY FOR 3-5 DAYS THEN DAILY AS NEEDED.  Marland Kitchen aspirin 81 MG tablet Take 81 mg by mouth.    Marland Kitchen glipiZIDE (GLUCOTROL XL) 5 MG 24 hr tablet Take 5 mg by mouth daily.  Marland Kitchen JARDIANCE 10 MG TABS tablet Take 1 tablet by mouth daily.  . metFORMIN (GLUMETZA) 1000 MG (MOD) 24 hr tablet Take 1,000 mg by mouth 2 (two) times daily with a meal.    . metoprolol succinate (TOPROL-XL) 25 MG 24 hr tablet Take 1 tablet (25 mg total) by mouth daily.  . rosuvastatin (CRESTOR) 5 MG tablet Take 5 mg by mouth daily.  Marland Kitchen telmisartan (MICARDIS) 80 MG tablet Take 80 mg by mouth.    . [DISCONTINUED] metoprolol succinate (TOPROL-XL) 25 MG 24 hr tablet Take 1 tablet (25 mg total) by mouth daily. Please keep upcoming appt in June with Dr. Anne Baker for future refills. Thank you     Allergies:   Tiazac [diltiazem hcl]   Social History   Socioeconomic History  . Marital status: Divorced    Spouse name: Not on file  . Number of children: Not on file  . Years of education: Not on file  . Highest education level: Not on file  Occupational History  . Not on file  Tobacco Use  . Smoking status: Never Smoker  . Smokeless tobacco: Never Used  Substance and Sexual Activity  . Alcohol use: Yes    Alcohol/week: 0.0 standard drinks  Comment: Rare  . Drug use: No  . Sexual activity: Yes    Birth control/protection: Surgical, Condom    Comment: intercourse age 19,  33 sexual partners, des neg  Other Topics Concern  . Not on file  Social History Narrative  . Not on file   Social Determinants of Health   Financial Resource Strain:   . Difficulty of Paying Living Expenses:   Food Insecurity:   . Worried About Charity fundraiser in the Last Year:   . Arboriculturist in the Last Year:   Transportation Needs:   . Film/video editor (Medical):   Marland Kitchen Lack of Transportation (Non-Medical):   Physical Activity:   . Days of Exercise per Week:   . Minutes of Exercise per  Session:   Stress:   . Feeling of Stress :   Social Connections:   . Frequency of Communication with Friends and Family:   . Frequency of Social Gatherings with Friends and Family:   . Attends Religious Services:   . Active Member of Clubs or Organizations:   . Attends Archivist Meetings:   Marland Kitchen Marital Status:      Family History: The patient's family history includes Heart disease in her paternal grandfather; Hypertension in her maternal grandfather; Sarcoidosis in her mother.  ROS:   Please see the history of present illness.    No fevers chills nausea vomiting syncope bleeding all other systems reviewed and are negative.  EKGs/Labs/Other Studies Reviewed:    The following studies were reviewed today: Nuclear stress test 06/11/2017: -Low risk no ischemia  Echocardiogram 09/2018: - EF 69%, grade 1 diastolic dysfunction  EKG:  EKG is  ordered today.  The ekg ordered today demonstrates normal sinus rhythm 69 with no other abnormalities.  Recent Labs: No results found for requested labs within last 8760 hours.  Recent Lipid Panel No results found for: CHOL, TRIG, HDL, CHOLHDL, VLDL, LDLCALC, LDLDIRECT  Physical Exam:    VS:  BP (!) 160/80   Pulse 69   Ht 5\' 4"  (1.626 m)   Wt 159 lb (72.1 kg)   SpO2 98%   BMI 27.29 kg/m     Wt Readings from Last 3 Encounters:  12/08/19 159 lb (72.1 kg)  12/01/19 158 lb (71.7 kg)  11/18/18 150 lb (68 kg)     GEN:  Well nourished, well developed in no acute distress HEENT: Normal NECK: No JVD; No carotid bruits LYMPHATICS: No lymphadenopathy CARDIAC: RRR, no murmurs, rubs, gallops RESPIRATORY:  Clear to auscultation without rales, wheezing or rhonchi  ABDOMEN: Soft, non-tender, non-distended MUSCULOSKELETAL:  No edema; No deformity  SKIN: Warm and dry NEUROLOGIC:  Alert and oriented x 3 PSYCHIATRIC:  Normal affect   ASSESSMENT:    1. Diabetes mellitus with coincident hypertension (HCC)   2. Palpitations   3.  Aortic atherosclerosis ([place])   4. Abnormal EKG    PLAN:    In order of problems listed above:  Palpitations/PVCs/PACs - Continue with Toprol. - Event monitor reassuring with only PVCs and PACs.  Benign.  Discussed with her.  No evidence of PSVT. Very seldom now with Toprol.  -Normal EF 65%.  Diabetes with hypertension -Medications reviewed.  She is off of HCTZ.  She is doing well.  Blood pressure under good control. 130/80 at home.  I have asked her to record more blood pressure readings at home and let us know in about a month to make sure that her blood pressure  still under good control.  It was a little bit elevated today in clinic.  Could be situational.  On Jardiance.  Excellent.  Hyperlipidemia - Crestor 5 mg.  Excellent.  Tolerating well.  No myalgias.  Prior LDL 87 hemoglobin 13.7 creatinine 1.04 TSH 1.35  Aortic atherosclerosis -- seen on CT scan prior. LDL goal <70. Statin.    Medication Adjustments/Labs and Tests Ordered: Current medicines are reviewed at length with the patient today.  Concerns regarding medicines are outlined above.  Orders Placed This Encounter  Procedures  . EKG 12-Lead   Meds ordered this encounter  Medications  . metoprolol succinate (TOPROL-XL) 25 MG 24 hr tablet    Sig: Take 1 tablet (25 mg total) by mouth daily.    Dispense:  90 tablet    Refill:  3    Patient Instructions  Medication Instructions:  The current medical regimen is effective;  continue present plan and medications.  *If you need a refill on your cardiac medications before your next appointment, please call your pharmacy*  Follow-Up: At [facility], you and your health needs are our priority.  As part of our continuing mission to provide you with exceptional heart care, we have created designated Provider Care Teams.  These Care Teams include your primary Cardiologist (physician) and Advanced Practice Providers (APPs -  Physician Assistants and Nurse Practitioners) who  all work together to provide you with the care you need, when you need it.  We recommend signing up for the patient portal called "MyChart".  Sign up information is provided on this After Visit Summary.  MyChart is used to connect with patients for Virtual Visits (Telemedicine).  Patients are able to view lab/test results, encounter notes, upcoming appointments, etc.  Non-urgent messages can be sent to your provider as well.   To learn more about what you can do with MyChart, go to [email or web address].    Your next appointment:   12 month(s)  The format for your next appointment:   In Person  Provider:   Donato Schultz, MD   Please keep a check on your blood pressure and send Dr Candace Baker a log of them in about 1 month.  You can either send this through MyChart, fax a copy [phone]) or phone them in.    Signed, Candace Schultz, MD  12/08/2019 9:29 AM    [facility]

## 2019-12-11 ENCOUNTER — Other Ambulatory Visit: Payer: Self-pay | Admitting: Nurse Practitioner

## 2019-12-13 NOTE — Telephone Encounter (Signed)
Thank you for clarifying! We will wait to refill.

## 2019-12-13 NOTE — Telephone Encounter (Signed)
Looks like I sent a refill 11/01/2019 with 90 tablets. Can you clarify that she is not taking this incorrectly? She should only be taking it twice a day for 3-5 days if she has an outbreak, otherwise she may take it daily for prevention in between outbreaks or not at all. Thank you

## 2019-12-13 NOTE — Telephone Encounter (Signed)
Patient was unaware pharmacy had requested. She said she just filled it recently and has plenty. She does take one every day to prevent outbreaks although approximately 3 mos ago she had outbreak and took it twice daily x 5 days.

## 2020-02-15 ENCOUNTER — Other Ambulatory Visit: Payer: Self-pay | Admitting: Nurse Practitioner

## 2020-05-07 ENCOUNTER — Other Ambulatory Visit: Payer: Self-pay | Admitting: Nurse Practitioner

## 2020-06-04 ENCOUNTER — Other Ambulatory Visit: Payer: Self-pay | Admitting: Nurse Practitioner

## 2020-07-10 DIAGNOSIS — E1165 Type 2 diabetes mellitus with hyperglycemia: Secondary | ICD-10-CM | POA: Diagnosis not present

## 2020-07-10 DIAGNOSIS — I10 Essential (primary) hypertension: Secondary | ICD-10-CM | POA: Diagnosis not present

## 2020-07-10 DIAGNOSIS — E7800 Pure hypercholesterolemia, unspecified: Secondary | ICD-10-CM | POA: Diagnosis not present

## 2020-08-07 DIAGNOSIS — E1165 Type 2 diabetes mellitus with hyperglycemia: Secondary | ICD-10-CM | POA: Diagnosis not present

## 2020-08-07 DIAGNOSIS — I10 Essential (primary) hypertension: Secondary | ICD-10-CM | POA: Diagnosis not present

## 2020-08-07 DIAGNOSIS — K219 Gastro-esophageal reflux disease without esophagitis: Secondary | ICD-10-CM | POA: Diagnosis not present

## 2020-08-07 DIAGNOSIS — E7800 Pure hypercholesterolemia, unspecified: Secondary | ICD-10-CM | POA: Diagnosis not present

## 2020-08-07 DIAGNOSIS — N183 Chronic kidney disease, stage 3 unspecified: Secondary | ICD-10-CM | POA: Diagnosis not present

## 2020-08-07 DIAGNOSIS — E119 Type 2 diabetes mellitus without complications: Secondary | ICD-10-CM | POA: Diagnosis not present

## 2020-08-07 DIAGNOSIS — E118 Type 2 diabetes mellitus with unspecified complications: Secondary | ICD-10-CM | POA: Diagnosis not present

## 2020-08-07 DIAGNOSIS — N189 Chronic kidney disease, unspecified: Secondary | ICD-10-CM | POA: Diagnosis not present

## 2020-08-07 DIAGNOSIS — E785 Hyperlipidemia, unspecified: Secondary | ICD-10-CM | POA: Diagnosis not present

## 2020-08-07 DIAGNOSIS — G8929 Other chronic pain: Secondary | ICD-10-CM | POA: Diagnosis not present

## 2020-08-07 DIAGNOSIS — E1143 Type 2 diabetes mellitus with diabetic autonomic (poly)neuropathy: Secondary | ICD-10-CM | POA: Diagnosis not present

## 2020-09-13 ENCOUNTER — Other Ambulatory Visit: Payer: Self-pay | Admitting: Nurse Practitioner

## 2020-09-15 DIAGNOSIS — E785 Hyperlipidemia, unspecified: Secondary | ICD-10-CM | POA: Diagnosis not present

## 2020-09-15 DIAGNOSIS — E7800 Pure hypercholesterolemia, unspecified: Secondary | ICD-10-CM | POA: Diagnosis not present

## 2020-09-15 DIAGNOSIS — N183 Chronic kidney disease, stage 3 unspecified: Secondary | ICD-10-CM | POA: Diagnosis not present

## 2020-09-15 DIAGNOSIS — G8929 Other chronic pain: Secondary | ICD-10-CM | POA: Diagnosis not present

## 2020-09-15 DIAGNOSIS — E118 Type 2 diabetes mellitus with unspecified complications: Secondary | ICD-10-CM | POA: Diagnosis not present

## 2020-09-15 DIAGNOSIS — E1143 Type 2 diabetes mellitus with diabetic autonomic (poly)neuropathy: Secondary | ICD-10-CM | POA: Diagnosis not present

## 2020-09-15 DIAGNOSIS — E119 Type 2 diabetes mellitus without complications: Secondary | ICD-10-CM | POA: Diagnosis not present

## 2020-09-15 DIAGNOSIS — K219 Gastro-esophageal reflux disease without esophagitis: Secondary | ICD-10-CM | POA: Diagnosis not present

## 2020-09-15 DIAGNOSIS — E1165 Type 2 diabetes mellitus with hyperglycemia: Secondary | ICD-10-CM | POA: Diagnosis not present

## 2020-09-15 DIAGNOSIS — I10 Essential (primary) hypertension: Secondary | ICD-10-CM | POA: Diagnosis not present

## 2020-11-07 DIAGNOSIS — E7800 Pure hypercholesterolemia, unspecified: Secondary | ICD-10-CM | POA: Diagnosis not present

## 2020-11-07 DIAGNOSIS — R61 Generalized hyperhidrosis: Secondary | ICD-10-CM | POA: Diagnosis not present

## 2020-11-07 DIAGNOSIS — I10 Essential (primary) hypertension: Secondary | ICD-10-CM | POA: Diagnosis not present

## 2020-11-07 DIAGNOSIS — E1165 Type 2 diabetes mellitus with hyperglycemia: Secondary | ICD-10-CM | POA: Diagnosis not present

## 2020-11-15 DIAGNOSIS — I10 Essential (primary) hypertension: Secondary | ICD-10-CM | POA: Diagnosis not present

## 2020-11-15 DIAGNOSIS — G8929 Other chronic pain: Secondary | ICD-10-CM | POA: Diagnosis not present

## 2020-11-15 DIAGNOSIS — E785 Hyperlipidemia, unspecified: Secondary | ICD-10-CM | POA: Diagnosis not present

## 2020-11-15 DIAGNOSIS — E1165 Type 2 diabetes mellitus with hyperglycemia: Secondary | ICD-10-CM | POA: Diagnosis not present

## 2020-11-15 DIAGNOSIS — E119 Type 2 diabetes mellitus without complications: Secondary | ICD-10-CM | POA: Diagnosis not present

## 2020-11-15 DIAGNOSIS — K219 Gastro-esophageal reflux disease without esophagitis: Secondary | ICD-10-CM | POA: Diagnosis not present

## 2020-11-15 DIAGNOSIS — E118 Type 2 diabetes mellitus with unspecified complications: Secondary | ICD-10-CM | POA: Diagnosis not present

## 2020-11-15 DIAGNOSIS — E7800 Pure hypercholesterolemia, unspecified: Secondary | ICD-10-CM | POA: Diagnosis not present

## 2020-11-15 DIAGNOSIS — E1143 Type 2 diabetes mellitus with diabetic autonomic (poly)neuropathy: Secondary | ICD-10-CM | POA: Diagnosis not present

## 2020-11-15 DIAGNOSIS — N183 Chronic kidney disease, stage 3 unspecified: Secondary | ICD-10-CM | POA: Diagnosis not present

## 2020-11-16 DIAGNOSIS — M25519 Pain in unspecified shoulder: Secondary | ICD-10-CM | POA: Diagnosis not present

## 2020-11-17 DIAGNOSIS — M25511 Pain in right shoulder: Secondary | ICD-10-CM | POA: Diagnosis not present

## 2020-11-29 DIAGNOSIS — R1010 Upper abdominal pain, unspecified: Secondary | ICD-10-CM | POA: Diagnosis not present

## 2020-12-05 ENCOUNTER — Encounter: Payer: Self-pay | Admitting: Nurse Practitioner

## 2020-12-05 ENCOUNTER — Ambulatory Visit: Payer: Medicare Other | Admitting: Nurse Practitioner

## 2020-12-05 ENCOUNTER — Other Ambulatory Visit: Payer: Self-pay

## 2020-12-05 VITALS — BP 124/80 | Ht 63.0 in | Wt 151.0 lb

## 2020-12-05 DIAGNOSIS — Z9079 Acquired absence of other genital organ(s): Secondary | ICD-10-CM

## 2020-12-05 DIAGNOSIS — Z9289 Personal history of other medical treatment: Secondary | ICD-10-CM | POA: Diagnosis not present

## 2020-12-05 DIAGNOSIS — Z90722 Acquired absence of ovaries, bilateral: Secondary | ICD-10-CM

## 2020-12-05 DIAGNOSIS — Z01419 Encounter for gynecological examination (general) (routine) without abnormal findings: Secondary | ICD-10-CM

## 2020-12-05 DIAGNOSIS — Z90710 Acquired absence of both cervix and uterus: Secondary | ICD-10-CM | POA: Diagnosis not present

## 2020-12-05 DIAGNOSIS — R232 Flushing: Secondary | ICD-10-CM

## 2020-12-05 NOTE — Patient Instructions (Addendum)
Candace Baker, Amberen, Ginseng, OR Black cohosh Vitamin D 1000 IU  Health Maintenance After Age 72 After age 25, you are at a higher risk for certain long-term diseases and infections as well as injuries from falls. Falls are a major cause of broken bones and head injuries in people who are older than age 22. Getting regular preventive care can help to keep you healthy and well. Preventive care includes getting regular testing and making lifestyle changes as recommended by your health care provider. Talk with your health care provider about:  Which screenings and tests you should have. A screening is a test that checks for a disease when you have no symptoms.  A diet and exercise plan that is right for you. What should I know about screenings and tests to prevent falls? Screening and testing are the best ways to find a health problem early. Early diagnosis and treatment give you the best chance of managing medical conditions that are common after age 77. Certain conditions and lifestyle choices may make you more likely to have a fall. Your health care provider may recommend:  Regular vision checks. Poor vision and conditions such as cataracts can make you more likely to have a fall. If you wear glasses, make sure to get your prescription updated if your vision changes.  Medicine review. Work with your health care provider to regularly review all of the medicines you are taking, including over-the-counter medicines. Ask your health care provider about any side effects that may make you more likely to have a fall. Tell your health care provider if any medicines that you take make you feel dizzy or sleepy.  Osteoporosis screening. Osteoporosis is a condition that causes the bones to get weaker. This can make the bones weak and cause them to break more easily.  Blood pressure screening. Blood pressure changes and medicines to control blood pressure can make you feel dizzy.  Strength and balance checks.  Your health care provider may recommend certain tests to check your strength and balance while standing, walking, or changing positions.  Foot health exam. Foot pain and numbness, as well as not wearing proper footwear, can make you more likely to have a fall.  Depression screening. You may be more likely to have a fall if you have a fear of falling, feel emotionally low, or feel unable to do activities that you used to do.  Alcohol use screening. Using too much alcohol can affect your balance and may make you more likely to have a fall. What actions can I take to lower my risk of falls? General instructions  Talk with your health care provider about your risks for falling. Tell your health care provider if: ? You fall. Be sure to tell your health care provider about all falls, even ones that seem minor. ? You feel dizzy, sleepy, or off-balance.  Take over-the-counter and prescription medicines only as told by your health care provider. These include any supplements.  Eat a healthy diet and maintain a healthy weight. A healthy diet includes low-fat dairy products, low-fat (lean) meats, and fiber from whole grains, beans, and lots of fruits and vegetables. Home safety  Remove any tripping hazards, such as rugs, cords, and clutter.  Install safety equipment such as grab bars in bathrooms and safety rails on stairs.  Keep rooms and walkways well-lit. Activity  Follow a regular exercise program to stay fit. This will help you maintain your balance. Ask your health care provider what types of exercise are  appropriate for you.  If you need a cane or walker, use it as recommended by your health care provider.  Wear supportive shoes that have nonskid soles.   Lifestyle  Do not drink alcohol if your health care provider tells you not to drink.  If you drink alcohol, limit how much you have: ? 0-1 drink a day for women. ? 0-2 drinks a day for men.  Be aware of how much alcohol is in your  drink. In the [place] one drink equals one typical bottle of beer (12 oz), one-half glass of wine (5 oz), or one shot of hard liquor (1 oz).  Do not use any products that contain nicotine or tobacco, such as cigarettes and e-cigarettes. If you need help quitting, ask your health care provider. Summary  Having a healthy lifestyle and getting preventive care can help to protect your health and wellness after age 50.  Screening and testing are the best way to find a health problem early and help you avoid having a fall. Early diagnosis and treatment give you the best chance for managing medical conditions that are more common for people who are older than age 46.  Falls are a major cause of broken bones and head injuries in people who are older than age 31. Take precautions to prevent a fall at home.  Work with your health care provider to learn what changes you can make to improve your health and wellness and to prevent falls. This information is not intended to replace advice given to you by your health care provider. Make sure you discuss any questions you have with your health care provider. Document Revised: 10/15/2018 Document Reviewed: 05/07/2017 Elsevier Patient Education  2021 [employer].

## 2020-12-05 NOTE — Progress Notes (Signed)
   Candace Baker May 10, 1951 [id number]   History:  70 y.o. G2P2002 presents for breast and pelvic exam. 1989 TAH for endometriosis, subsequent BSO 1991 - on no HRT. She was on HRT for many years prior and has noticed the last few years her hot flashes have returned. They are tolerable but do occur most days. Reports that endocrinology checked TSH and it was normal. Normal pap and mammogram history. HTN managed by cardiology, DM managed by endocrinology.  Gynecologic History No LMP recorded. Patient has had a hysterectomy.   Contraception: status post hysterectomy  Health Maintenance Last Pap: No longer screening per guidelines Last mammogram: 12/09/2019. Results were: normal Last colonoscopy: 2019 Last Dexa: 11/04/2016. Results were: normal  Past medical history, past surgical history, family history and social history were all reviewed and documented in the EPIC chart. Divorced. Retired. Daughter with history of breast cancer 5 years ago.   ROS:  A ROS was performed and pertinent positives and negatives are included.  Exam:  Vitals:   12/05/20 0830  BP: 124/80  Weight: 151 lb (68.5 kg)  Height: 5\' 3"  (1.6 m)   Body mass index is 26.75 kg/m.  General appearance:  Normal Thyroid:  Symmetrical, normal in size, without palpable masses or nodularity. Respiratory  Auscultation:  Clear without wheezing or rhonchi Cardiovascular  Auscultation:  Regular rate, without rubs, murmurs or gallops  Edema/varicosities:  Not grossly evident Abdominal  Soft,nontender, without masses, guarding or rebound.  Liver/spleen:  No organomegaly noted  Hernia:  None appreciated  Skin  Inspection:  Grossly normal Breasts: Examined lying and sitting.   Right: Without masses, retractions, nipple discharge or axillary adenopathy.   Left: Without masses, retractions, nipple discharge or axillary adenopathy. Genitourinary   Inguinal/mons:  Normal without inguinal adenopathy  External genitalia:   Normal appearing vulva with no masses, tenderness, or lesions  BUS/Urethra/Skene's glands:  Normal  Vagina:  Normal appearing with normal color and discharge, no lesions. Atrophic changes  Cervix:  Absent  Uterus:  Absent  Adnexa/parametria:     Rt: Normal in size, without masses or tenderness.   Lt: Normal in size, without masses or tenderness.  Anus and perineum: Normal  Digital rectal exam: Normal sphincter tone without palpated masses or tenderness  Assessment/Plan:  70 y.o. G 2P2002 for breast and pelvic exam.   Well female exam with routine gynecological exam - Education provided on SBEs, importance of preventative screenings, current guidelines, high calcium diet, regular exercise, and multivitamin daily. Labs done elsewhere.   History of total abdominal hysterectomy and bilateral salpingo-oophorectomy - 1989 TAH for endometriosis, 1991 BSO. Was on HRT for many years prior.   Hot flashes - They are tolerable but do occur most days. She was on HRT for many years prior and says they have increased over the last few years. Reports that endocrinology checked TSH and it was normal. Discussed lifestyle changes and triggers to avoid. Also provided her with OTC supplement options.   Screening for cervical cancer - Normal Pap history.  No longer screening per guidelines.   Screening for breast cancer - Normal mammogram history.  Continue annual screenings.  Normal breast exam today. Mammogram scheduled 6/6.  Screening for colon cancer - 2019 colonoscopy. Will repeat at GI's recommended interval.   Screening for osteoporosis - Normal DXA April 2018. Will repeat next year. Recommend Vitamin D supplement and regular exercise.   Return in 1 year for annual.    May 2018 DNP, 8:46 AM 12/05/2020

## 2020-12-11 ENCOUNTER — Encounter: Payer: Self-pay | Admitting: *Deleted

## 2020-12-11 DIAGNOSIS — Z1231 Encounter for screening mammogram for malignant neoplasm of breast: Secondary | ICD-10-CM | POA: Diagnosis not present

## 2021-01-24 ENCOUNTER — Other Ambulatory Visit: Payer: Self-pay | Admitting: Cardiology

## 2021-02-05 ENCOUNTER — Other Ambulatory Visit: Payer: Self-pay

## 2021-02-05 ENCOUNTER — Encounter: Payer: Self-pay | Admitting: Cardiology

## 2021-02-05 ENCOUNTER — Ambulatory Visit: Payer: Medicare Other | Admitting: Cardiology

## 2021-02-05 VITALS — BP 150/84 | HR 62 | Ht 63.0 in | Wt 153.2 lb

## 2021-02-05 DIAGNOSIS — R002 Palpitations: Secondary | ICD-10-CM

## 2021-02-05 DIAGNOSIS — I700 Atherosclerosis of aorta: Secondary | ICD-10-CM | POA: Diagnosis not present

## 2021-02-05 DIAGNOSIS — R9431 Abnormal electrocardiogram [ECG] [EKG]: Secondary | ICD-10-CM

## 2021-02-05 MED ORDER — ROSUVASTATIN CALCIUM 10 MG PO TABS
10.0000 mg | ORAL_TABLET | Freq: Every day | ORAL | 3 refills | Status: DC
Start: 2021-02-05 — End: 2022-01-03

## 2021-02-05 NOTE — Progress Notes (Signed)
Cardiology Office Note:    Date:  02/05/2021   ID:  Clemmie, Baker Sep 10, 1950, MRN [id number]  PCP:  Daisy Floro, MD   [facility] Providers Cardiologist:  Donato Schultz, MD     Referring MD: Catha Gosselin, MD   History of Present Illness:    Candace Baker is a 70 y.o. female here for the follow-up of palpitations PVCs PACs, aortic atherosclerosis previously seen on CT personally reviewed, prior dizziness.  She previously had random bouts of dizziness feeling her heart rate slowing down does not seem to be related to posture.  She thought that the symptoms were because she was off of the HCTZ.  Per EKG demonstrated normal sinus rhythm D-dimer was negative troponins were negative TSH was normal.  Possible dehydration with increased creatinine.  Telemetry in the hospital setting was normal.  Echocardiogram demonstrated EF of 65% with grade 1 diastolic dysfunction.  Perhaps vagal induced symptoms with dehydration.  Event monitor was performed and showed occasional PVCs and PACs with symptoms of flutters.  No SVT detected.  Overall reassuring.  Occasionally she will feel some skips.  This morning felt a few.  Likely PVCs or PACs.  She is tolerating her low-dose Crestor 5 mg well however her LDL is still not optimal.  Occasional PVC, palpitations  Past Medical History:  Diagnosis Date   Diabetes mellitus    Elevated cholesterol    Endometriosis    Gallstones    Hypertension    STD (sexually transmitted disease)    HSV    Past Surgical History:  Procedure Laterality Date   ABDOMINAL HYSTERECTOMY  1989   TAH    CHOLECYSTECTOMY     OOPHORECTOMY  1991   BSO   TONSILLECTOMY      Current Medications: Current Meds  Medication Sig   acyclovir (ZOVIRAX) 400 MG tablet TAKE ONE TABLET BY MOUTH TWICE DAILY FOR 3-5 DAYS THEN DAILY AS NEEDED.   aspirin 81 MG tablet Take 81 mg by mouth.   glipiZIDE (GLUCOTROL XL) 5 MG 24 hr tablet Take 5 mg by mouth daily.    JARDIANCE 10 MG TABS tablet Take 1 tablet by mouth daily.   metFORMIN (GLUMETZA) 1000 MG (MOD) 24 hr tablet Take 1,000 mg by mouth 2 (two) times daily with a meal.   metoprolol succinate (TOPROL-XL) 25 MG 24 hr tablet TAKE 1 TABLET BY MOUTH EVERY DAY   Multiple Vitamins-Minerals (WOMENS 50+ ADVANCED) CAPS See admin instructions.   rosuvastatin (CRESTOR) 10 MG tablet Take 1 tablet (10 mg total) by mouth daily.   telmisartan (MICARDIS) 80 MG tablet Take 80 mg by mouth.   [DISCONTINUED] rosuvastatin (CRESTOR) 5 MG tablet Take 5 mg by mouth daily.     Allergies:   Tiazac [diltiazem hcl]   Social History   Socioeconomic History   Marital status: Divorced    Spouse name: Not on file   Number of children: Not on file   Years of education: Not on file   Highest education level: Not on file  Occupational History   Not on file  Tobacco Use   Smoking status: Never   Smokeless tobacco: Never  Vaping Use   Vaping Use: Never used  Substance and Sexual Activity   Alcohol use: Yes    Alcohol/week: 0.0 standard drinks    Comment: Rare   Drug use: No   Sexual activity: Yes    Birth control/protection: Surgical  Other Topics Concern   Not on file  Social History Narrative   Not on file   Social Determinants of Health   Financial Resource Strain: Not on file  Food Insecurity: Not on file  Transportation Needs: Not on file  Physical Activity: Not on file  Stress: Not on file  Social Connections: Not on file     Family History: The patient's family history includes Heart disease in her paternal grandfather; Hypertension in her maternal grandfather; Sarcoidosis in her mother.  ROS:   Please see the history of present illness.    No chest pain fevers chills nausea vomiting syncope all other systems reviewed and are negative.  EKGs/Labs/Other Studies Reviewed:    The following studies were reviewed today: Nuclear stress test 2018 was low risk with no ischemia  Echocardiogram 2020  showed normal EF 65% with grade 1 diastolic dysfunction  EKG:  EKG is  ordered today.  The ekg ordered today demonstrates prior EKG showed sinus rhythm 69 with no other abnormalities  Recent Labs: No results found for requested labs within last 8760 hours.  Recent Lipid Panel No results found for: CHOL, TRIG, HDL, CHOLHDL, VLDL, LDLCALC, LDLDIRECT   Risk Assessment/Calculations:          Physical Exam:    VS:  BP (!) 150/84   Pulse 62   Ht 5\' 3"  (1.6 m)   Wt 153 lb 3.2 oz (69.5 kg)   SpO2 98%   BMI 27.14 kg/m     Wt Readings from Last 3 Encounters:  02/05/21 153 lb 3.2 oz (69.5 kg)  12/05/20 151 lb (68.5 kg)  12/08/19 159 lb (72.1 kg)     GEN:  Well nourished, well developed in no acute distress HEENT: Normal NECK: No JVD; No carotid bruits LYMPHATICS: No lymphadenopathy CARDIAC: RRR, no murmurs, rubs, gallops RESPIRATORY:  Clear to auscultation without rales, wheezing or rhonchi  ABDOMEN: Soft, non-tender, non-distended MUSCULOSKELETAL:  No edema; No deformity  SKIN: Warm and dry NEUROLOGIC:  Alert and oriented x 3 PSYCHIATRIC:  Normal affect   ASSESSMENT:    1. Palpitations   2. Abnormal EKG   3. Aortic atherosclerosis (HCC)    PLAN:    In order of problems listed above:  Palpitations/PVCs/PACs - We are can continue with Toprol to help suppress. - Event monitor was reassuring showing only PVCs and PACs.  There is no evidence of PSVT. - Normal EF.  Diabetes with hypertension - Medications have been reviewed as above she is now off of the HCTZ.  Doing well.  A little bit high today.  Recording blood pressures at home.  May have a degree of whitecoat hypertension.  She is on Jardiance for her diabetes continue with current medical management.  Excellent.  Hyperlipidemia - Continue with Crestor 5 mg once a day for medical management given her atherosclerosis of aorta.  It would be nice for her to increase this dose to 10 mg if possible.  Tolerating well  without any myalgias.  We will go ahead and change her Crestor to 10 mg once a day.  This will optimize her plaque stabilization.  She will be seeing Dr. 02/07/20 soon in November.  They will be checking lipid panel and ALT at that time.  Aortic atherosclerosis seen on prior CT scan of like her LDL goal to be less than 70.  Continue with statin use especially with diabetes.  1 year follow-up     Medication Adjustments/Labs and Tests Ordered: Current medicines are reviewed at length with the patient today.  Concerns regarding medicines are outlined above.  Orders Placed This Encounter  Procedures   EKG 12-Lead    Meds ordered this encounter  Medications   rosuvastatin (CRESTOR) 10 MG tablet    Sig: Take 1 tablet (10 mg total) by mouth daily.    Dispense:  90 tablet    Refill:  3     Patient Instructions  Medication Instructions:  Please increase your Crestor to 10 mg a day. Continue all other medications as listed.  *If you need a refill on your cardiac medications before your next appointment, please call your pharmacy*  Follow-Up: At [facility], you and your health needs are our priority.  As part of our continuing mission to provide you with exceptional heart care, we have created designated Provider Care Teams.  These Care Teams include your primary Cardiologist (physician) and Advanced Practice Providers (APPs -  Physician Assistants and Nurse Practitioners) who all work together to provide you with the care you need, when you need it.  We recommend signing up for the patient portal called "MyChart".  Sign up information is provided on this After Visit Summary.  MyChart is used to connect with patients for Virtual Visits (Telemedicine).  Patients are able to view lab/test results, encounter notes, upcoming appointments, etc.  Non-urgent messages can be sent to your provider as well.   To learn more about what you can do with MyChart, go to [email or web address].    Your  next appointment:   1 year(s)  The format for your next appointment:   In Person  Provider:   Donato Schultz, MD  Thank you for choosing [facility]!!     Signed, Donato Schultz, MD  02/05/2021 9:03 AM    [facility]

## 2021-02-05 NOTE — Patient Instructions (Signed)
Medication Instructions:  Please increase your Crestor to 10 mg a day. Continue all other medications as listed.  *If you need a refill on your cardiac medications before your next appointment, please call your pharmacy*  Follow-Up: At [facility], you and your health needs are our priority.  As part of our continuing mission to provide you with exceptional heart care, we have created designated Provider Care Teams.  These Care Teams include your primary Cardiologist (physician) and Advanced Practice Providers (APPs -  Physician Assistants and Nurse Practitioners) who all work together to provide you with the care you need, when you need it.  We recommend signing up for the patient portal called "MyChart".  Sign up information is provided on this After Visit Summary.  MyChart is used to connect with patients for Virtual Visits (Telemedicine).  Patients are able to view lab/test results, encounter notes, upcoming appointments, etc.  Non-urgent messages can be sent to your provider as well.   To learn more about what you can do with MyChart, go to [email or web address].    Your next appointment:   1 year(s)  The format for your next appointment:   In Person  Provider:   Donato Schultz, MD  Thank you for choosing [facility]!!

## 2021-04-15 ENCOUNTER — Other Ambulatory Visit: Payer: Self-pay | Admitting: Nurse Practitioner

## 2021-04-16 NOTE — Telephone Encounter (Signed)
Annual exam as in 11/2020

## 2021-04-21 ENCOUNTER — Other Ambulatory Visit: Payer: Self-pay | Admitting: Cardiology

## 2021-05-10 DIAGNOSIS — I10 Essential (primary) hypertension: Secondary | ICD-10-CM | POA: Diagnosis not present

## 2021-05-10 DIAGNOSIS — E1165 Type 2 diabetes mellitus with hyperglycemia: Secondary | ICD-10-CM | POA: Diagnosis not present

## 2021-05-10 DIAGNOSIS — E7800 Pure hypercholesterolemia, unspecified: Secondary | ICD-10-CM | POA: Diagnosis not present

## 2021-05-23 DIAGNOSIS — E7800 Pure hypercholesterolemia, unspecified: Secondary | ICD-10-CM | POA: Diagnosis not present

## 2021-05-23 DIAGNOSIS — K219 Gastro-esophageal reflux disease without esophagitis: Secondary | ICD-10-CM | POA: Diagnosis not present

## 2021-05-23 DIAGNOSIS — G8929 Other chronic pain: Secondary | ICD-10-CM | POA: Diagnosis not present

## 2021-05-23 DIAGNOSIS — E1165 Type 2 diabetes mellitus with hyperglycemia: Secondary | ICD-10-CM | POA: Diagnosis not present

## 2021-05-23 DIAGNOSIS — N183 Chronic kidney disease, stage 3 unspecified: Secondary | ICD-10-CM | POA: Diagnosis not present

## 2021-05-23 DIAGNOSIS — E1143 Type 2 diabetes mellitus with diabetic autonomic (poly)neuropathy: Secondary | ICD-10-CM | POA: Diagnosis not present

## 2021-05-23 DIAGNOSIS — E119 Type 2 diabetes mellitus without complications: Secondary | ICD-10-CM | POA: Diagnosis not present

## 2021-05-23 DIAGNOSIS — E785 Hyperlipidemia, unspecified: Secondary | ICD-10-CM | POA: Diagnosis not present

## 2021-05-23 DIAGNOSIS — I10 Essential (primary) hypertension: Secondary | ICD-10-CM | POA: Diagnosis not present

## 2021-06-25 DIAGNOSIS — E1165 Type 2 diabetes mellitus with hyperglycemia: Secondary | ICD-10-CM | POA: Diagnosis not present

## 2021-06-25 DIAGNOSIS — Z7984 Long term (current) use of oral hypoglycemic drugs: Secondary | ICD-10-CM | POA: Diagnosis not present

## 2021-06-25 DIAGNOSIS — E785 Hyperlipidemia, unspecified: Secondary | ICD-10-CM | POA: Diagnosis not present

## 2021-06-25 DIAGNOSIS — Z0000 Encounter for general adult medical examination without abnormal findings: Secondary | ICD-10-CM | POA: Diagnosis not present

## 2021-06-25 DIAGNOSIS — I10 Essential (primary) hypertension: Secondary | ICD-10-CM | POA: Diagnosis not present

## 2021-06-25 DIAGNOSIS — I700 Atherosclerosis of aorta: Secondary | ICD-10-CM | POA: Diagnosis not present

## 2021-07-06 DIAGNOSIS — R0989 Other specified symptoms and signs involving the circulatory and respiratory systems: Secondary | ICD-10-CM | POA: Diagnosis not present

## 2021-07-06 DIAGNOSIS — I10 Essential (primary) hypertension: Secondary | ICD-10-CM | POA: Diagnosis not present

## 2021-07-30 ENCOUNTER — Other Ambulatory Visit: Payer: Self-pay | Admitting: Nurse Practitioner

## 2021-09-07 DIAGNOSIS — E1165 Type 2 diabetes mellitus with hyperglycemia: Secondary | ICD-10-CM | POA: Diagnosis not present

## 2021-09-07 DIAGNOSIS — I10 Essential (primary) hypertension: Secondary | ICD-10-CM | POA: Diagnosis not present

## 2021-09-07 DIAGNOSIS — E7800 Pure hypercholesterolemia, unspecified: Secondary | ICD-10-CM | POA: Diagnosis not present

## 2021-09-14 ENCOUNTER — Emergency Department (HOSPITAL_COMMUNITY)
Admission: EM | Admit: 2021-09-14 | Discharge: 2021-09-14 | Disposition: A | Discharge status: Home / Self Care | Payer: Medicare Other | Attending: Emergency Medicine | Admitting: Emergency Medicine

## 2021-09-14 ENCOUNTER — Encounter (HOSPITAL_COMMUNITY): Payer: Self-pay

## 2021-09-14 ENCOUNTER — Other Ambulatory Visit: Payer: Self-pay

## 2021-09-14 ENCOUNTER — Emergency Department (HOSPITAL_COMMUNITY): Payer: Medicare Other

## 2021-09-14 DIAGNOSIS — I10 Essential (primary) hypertension: Secondary | ICD-10-CM | POA: Diagnosis not present

## 2021-09-14 DIAGNOSIS — Z79899 Other long term (current) drug therapy: Secondary | ICD-10-CM | POA: Diagnosis not present

## 2021-09-14 DIAGNOSIS — Z7982 Long term (current) use of aspirin: Secondary | ICD-10-CM | POA: Diagnosis not present

## 2021-09-14 DIAGNOSIS — R519 Headache, unspecified: Secondary | ICD-10-CM | POA: Insufficient documentation

## 2021-09-14 LAB — COMPREHENSIVE METABOLIC PANEL
ALT: 12 U/L (ref 0–44)
AST: 16 U/L (ref 15–41)
Albumin: 4.3 g/dL (ref 3.5–5.0)
Alkaline Phosphatase: 75 U/L (ref 38–126)
Anion gap: 8 (ref 5–15)
BUN: 11 mg/dL (ref 8–23)
CO2: 28 mmol/L (ref 22–32)
Calcium: 10.1 mg/dL (ref 8.9–10.3)
Chloride: 102 mmol/L (ref 98–111)
Creatinine, Ser: 0.86 mg/dL (ref 0.44–1.00)
GFR, Estimated: >60 mL/min (ref >60)
Glucose, Bld: 156 mg/dL — ABNORMAL HIGH (ref 70–99)
Potassium: 3.9 mmol/L (ref 3.5–5.1)
Sodium: 138 mmol/L (ref 135–145)
Total Bilirubin: 0.8 mg/dL (ref 0.3–1.2)
Total Protein: 7.7 g/dL (ref 6.5–8.1)

## 2021-09-14 LAB — CBC WITH DIFFERENTIAL/PLATELET
Abs Immature Granulocytes: 0.02 10*3/uL (ref 0.00–0.07)
Basophils Absolute: 0 10*3/uL (ref 0.0–0.1)
Basophils Relative: 1 %
Eosinophils Absolute: 0.1 10*3/uL (ref 0.0–0.5)
Eosinophils Relative: 1 %
HCT: 46 % (ref 36.0–46.0)
Hemoglobin: 14.6 g/dL (ref 12.0–15.0)
Immature Granulocytes: 0 %
Lymphocytes Relative: 29 %
Lymphs Abs: 2.1 10*3/uL (ref 0.7–4.0)
MCH: 29.1 pg (ref 26.0–34.0)
MCHC: 31.7 g/dL (ref 30.0–36.0)
MCV: 91.6 fL (ref 80.0–100.0)
Monocytes Absolute: 0.5 10*3/uL (ref 0.1–1.0)
Monocytes Relative: 7 %
Neutro Abs: 4.5 10*3/uL (ref 1.7–7.7)
Neutrophils Relative %: 62 %
Platelets: 302 10*3/uL (ref 150–400)
RBC: 5.02 MIL/uL (ref 3.87–5.11)
RDW: 13.2 % (ref 11.5–15.5)
WBC: 7.2 10*3/uL (ref 4.0–10.5)
nRBC: 0 % (ref 0.0–0.2)

## 2021-09-14 LAB — LIPASE, BLOOD: Lipase: 47 U/L (ref 11–51)

## 2021-09-14 MED ORDER — DIPHENHYDRAMINE HCL 50 MG/ML IJ SOLN
12.5000 mg | Freq: Once | INTRAMUSCULAR | Status: AC
  Administered 2021-09-14: 12.5 mg via INTRAVENOUS
  Filled 2021-09-14: qty 1

## 2021-09-14 MED ORDER — SODIUM CHLORIDE 0.9 % IV BOLUS
1000.0000 mL | Freq: Once | INTRAVENOUS | Status: AC
  Administered 2021-09-14: 1000 mL via INTRAVENOUS

## 2021-09-14 MED ORDER — METOCLOPRAMIDE HCL 5 MG/ML IJ SOLN
5.0000 mg | Freq: Once | INTRAMUSCULAR | Status: AC
  Administered 2021-09-14: 5 mg via INTRAVENOUS
  Filled 2021-09-14: qty 2

## 2021-09-14 NOTE — ED Notes (Signed)
Pt transported to CT ?

## 2021-09-14 NOTE — ED Triage Notes (Signed)
Patient c/o hypertension. Patient states she woke this AM and had a headache. Patient states she had a [place] health Care visit today and it was noted that her BP was 170/89. Patient states she took her BP after the [place] healthcare visit and it was 191/96.  BP in triage was 209/90. Patent continues to c/o a slight headache. ?

## 2021-09-14 NOTE — ED Provider Notes (Signed)
[facility]-EMERGENCY DEPT Provider Note   CSN: [id number] Arrival date & time: 09/14/21  1255     History  Chief Complaint  Patient presents with   Hypertension    Candace Baker is a 71 y.o. female.  71 yo F with a chief complaints of a left-sided headache.  This was noted this morning when she woke up.  She checked her blood pressure and noted it was elevated.  She had a home health visit today and also was noted to have high blood pressure.  Has checked it continually throughout the day and notes its been going up.  She feels like her headache has not changed.  Is left-sided and dull.  Nothing seems to make it significantly better or worse.  Has a history of headaches in the past and thinks this 1 feels somewhat similar.  Denies trauma denies neck pain denies one-sided numbness weakness denies difficulty speech or swallowing.  She denies chest pain or shortness of breath.  She then commented that she had some shortness of breath when walking in from the parking lot and when asked if this was typical for her she replied yes.   Hypertension      Home Medications Prior to Admission medications   Medication Sig Start Date End Date Taking? Authorizing Provider  acyclovir (ZOVIRAX) 400 MG tablet TAKE ONE TABLET BY MOUTH TWICE DAILY FOR 3-5 DAYS THEN DAILY AS NEEDED. 07/30/21   Wyline Beady A, NP  aspirin 81 MG tablet Take 81 mg by mouth.    [provider]  glipiZIDE (GLUCOTROL XL) 5 MG 24 hr tablet Take 5 mg by mouth daily. 09/21/18   [provider]  JARDIANCE 10 MG TABS tablet Take 1 tablet by mouth daily. 01/26/18   [provider]  metFORMIN (GLUMETZA) 1000 MG (MOD) 24 hr tablet Take 1,000 mg by mouth 2 (two) times daily with a meal.    [provider]  metoprolol succinate (TOPROL-XL) 25 MG 24 hr tablet TAKE 1 TABLET BY MOUTH EVERY DAY 04/23/21   Jake Bathe, MD  Multiple Vitamins-Minerals (WOMENS 50+ ADVANCED) CAPS  See admin instructions.    [provider]  rosuvastatin (CRESTOR) 10 MG tablet Take 1 tablet (10 mg total) by mouth daily. 02/05/21   Jake Bathe, MD  telmisartan (MICARDIS) 80 MG tablet Take 80 mg by mouth.    [provider]      Allergies    Tiazac [diltiazem hcl]    Review of Systems   Review of Systems  Physical Exam Updated Vital Signs BP (!) 179/73 (BP Location: Left Arm)    Pulse 77    Temp 97.8 F (36.6 C) (Oral)    Resp 18    Ht 5\' 4"  (1.626 m)    Wt 68 kg    SpO2 99%    BMI 25.75 kg/m  Physical Exam Vitals and nursing note reviewed.  Constitutional:      General: She is not in acute distress.    Appearance: She is well-developed. She is not diaphoretic.  HENT:     Head: Normocephalic and atraumatic.  Eyes:     Pupils: Pupils are equal, round, and reactive to light.  Cardiovascular:     Rate and Rhythm: Normal rate and regular rhythm.     Heart sounds: No murmur heard.   No friction rub. No gallop.  Pulmonary:     Effort: Pulmonary effort is normal.     Breath sounds:  No wheezing or rales.  Abdominal:     General: There is no distension.     Palpations: Abdomen is soft.     Tenderness: There is no abdominal tenderness.  Musculoskeletal:        General: No tenderness.     Cervical back: Normal range of motion and neck supple.  Skin:    General: Skin is warm and dry.  Neurological:     Mental Status: She is alert and oriented to person, place, and time.     GCS: GCS eye subscore is 4. GCS verbal subscore is 5. GCS motor subscore is 6.     Cranial Nerves: Cranial nerves 2-12 are intact.     Sensory: Sensation is intact.     Motor: Motor function is intact.     Coordination: Coordination is intact.     Comments: Benign neuro exam.  Ambulates without issue.  Psychiatric:        Behavior: Behavior normal.    ED Results / Procedures / Treatments   Labs (all labs ordered are listed, but only abnormal results are displayed) Labs Reviewed   COMPREHENSIVE METABOLIC PANEL - Abnormal; Notable for the following components:      Result Value   Glucose, Bld 156 (*)    All other components within normal limits  CBC WITH DIFFERENTIAL/PLATELET  LIPASE, BLOOD    EKG None  Radiology CT Head Wo Contrast  Result Date: 09/14/2021 CLINICAL DATA:  Headache, new or worsening (Age >= 50y) EXAM: CT HEAD WITHOUT CONTRAST TECHNIQUE: Contiguous axial images were obtained from the base of the skull through the vertex without intravenous contrast. RADIATION DOSE REDUCTION: This exam was performed according to the departmental dose-optimization program which includes automated exposure control, adjustment of the mA and/or kV according to patient size and/or use of iterative reconstruction technique. COMPARISON:  None. FINDINGS: Brain: There is no acute intracranial hemorrhage, mass effect, or edema. Gray-white differentiation is preserved. There is no extra-axial fluid collection. Ventricles and sulci are within normal limits in size and configuration. Vascular: There is atherosclerotic calcification at the skull base. Skull: Calvarium is unremarkable. Sinuses/Orbits: No acute finding. Other: None. IMPRESSION: No acute intracranial abnormality. Electronically Signed   By: Guadlupe Spanish M.D.   On: 09/14/2021 14:08    Procedures Procedures    Medications Ordered in ED Medications  sodium chloride 0.9 % bolus 1,000 mL (1,000 mLs Intravenous New Bag/Given (Non-Interop) 09/14/21 1407)  metoCLOPramide (REGLAN) injection 5 mg (5 mg Intravenous Given 09/14/21 1408)  diphenhydrAMINE (BENADRYL) injection 12.5 mg (12.5 mg Intravenous Given 09/14/21 1408)    ED Course/ Medical Decision Making/ A&P                           Medical Decision Making Amount and/or Complexity of Data Reviewed Labs: ordered. Radiology: ordered.  Risk Prescription drug management.   71 yo F with a chief complaints of hypertension and a headache.  This has been going on just  since this morning.  She does have a history of headaches in the past and thinks this feels somewhat similar.  She is mostly concerned about the blood pressure.  Has checked it multiple times a day and seems to keep going up.  She was recently started on Trulicity, denies any other medication change.  Denies any over-the-counter medicines.  She has a benign neurologic exam.  We will give a headache cocktail CT of the head basic blood work and reassess.  Patient feeling mildly better.  Her blood pressure has trended downward.  No leukocytosis no anemia no significant electrolyte abnormality renal function at baseline.  CT scan of the head is negative for acute intracranial process.  Patient is feeling better on reassessment.  Will discharge home.  PCP follow-up.  3:12 PM:  I have discussed the diagnosis/risks/treatment options with the patient.  Evaluation and diagnostic testing in the emergency department does not suggest an emergent condition requiring admission or immediate intervention beyond what has been performed at this time.  They will follow up with  PCP. We also discussed returning to the ED immediately if new or worsening sx occur. We discussed the sx which are most concerning (e.g., sudden worsening pain, fever, inability to tolerate by mouth) that necessitate immediate return. Medications administered to the patient during their visit and any new prescriptions provided to the patient are listed below.  Medications given during this visit Medications  sodium chloride 0.9 % bolus 1,000 mL (1,000 mLs Intravenous New Bag/Given (Non-Interop) 09/14/21 1407)  metoCLOPramide (REGLAN) injection 5 mg (5 mg Intravenous Given 09/14/21 1408)  diphenhydrAMINE (BENADRYL) injection 12.5 mg (12.5 mg Intravenous Given 09/14/21 1408)     The patient appears reasonably screen and/or stabilized for discharge and I doubt any other medical condition or other [facility] requiring further screening, evaluation, or treatment  in the ED at this time prior to discharge.           Final Clinical Impression(s) / ED Diagnoses Final diagnoses:  Acute nonintractable headache, unspecified headache type  Hypertension, unspecified type    Rx / DC Orders ED Discharge Orders     None         Melene PlanFloyd, Eleonora Peeler, DO 09/14/21 1512

## 2021-09-14 NOTE — Discharge Instructions (Signed)
Your CT scan of your head was normal, no concerning blood work finding.  Please check your blood pressure only once a day or as your doctor has instructed you to take it.  Please return to the emergency department for worsening headache confusion one-sided numbness or weakness difficulty speech or swallowing or if you develop chest pain or difficulty breathing. ?

## 2021-09-17 DIAGNOSIS — E118 Type 2 diabetes mellitus with unspecified complications: Secondary | ICD-10-CM | POA: Diagnosis not present

## 2021-09-17 DIAGNOSIS — I10 Essential (primary) hypertension: Secondary | ICD-10-CM | POA: Diagnosis not present

## 2021-09-20 ENCOUNTER — Telehealth: Payer: Self-pay | Admitting: Cardiology

## 2021-09-20 NOTE — Telephone Encounter (Signed)
Pt was seen in ED today for H/A and HTN.  Normal labs and CT scan. ?BP in ED today was 180/80, 177/82, 179/73. - HR 77. ?Pt takes Metoprolol Succinate 25 mg a day. ?PCP advised to increase to 25 mg (2) tablets daily and pt is asking if Dr Anne Fu would advise the same.  Will forward to MD for review and orders. ?

## 2021-09-20 NOTE — Telephone Encounter (Signed)
?  Pt c/o medication issue: ? ?1. Name of Medication: metoprolol succinate (TOPROL-XL) 25 MG 24 hr tablet ? ?2. How are you currently taking this medication (dosage and times per day)? TAKE 1 TABLET BY MOUTH EVERY DAY ? ?3. Are you having a reaction (difficulty breathing--STAT)?  ? ?4. What is your medication issue? Pt is calling, she said, lately her BP was elevated. She went to the ED and she was given fluids there and tests. She also called her pcp and the PA advised her to increased her metoprolol to take 2 tablets a day. She wanted to ask Dr. Anne Fu if that is ok. Today her BP is at 146/86  ? ?

## 2021-09-21 NOTE — Telephone Encounter (Signed)
Spoke with pt and advised per Dr Anne Fu - OK to take Metoprolol as instructed by PCP.  She is aware to continue to monitor BP at home.  She will f/u as scheduled unless she has further needs prior to then.   ?

## 2021-10-10 DIAGNOSIS — Z23 Encounter for immunization: Secondary | ICD-10-CM | POA: Diagnosis not present

## 2021-11-06 ENCOUNTER — Other Ambulatory Visit: Payer: Self-pay | Admitting: Cardiology

## 2021-12-05 NOTE — Progress Notes (Unsigned)
   Candace Baker 29-Nov-1950 [id number]   History:  71 y.o. G2P2002 presents for breast and pelvic exam. Postmenopausal - no HRT. S/P 1989 TAH for endometriosis, subsequent BSO 1991. Normal pap and mammogram history. HTN managed by cardiology, DM managed by endocrinology.  Gynecologic History No LMP recorded. Patient has had a hysterectomy.   Contraception: status post hysterectomy Sexually active: Yes  Health Maintenance Last Pap: 12/18/2010. Results were: Normal Last mammogram: 12/11/2020. Results were: Normal Last colonoscopy: 2019. Results were: Normal, 5-year recall Last Dexa: 11/04/2016. Results were: Normal  Past medical history, past surgical history, family history and social history were all reviewed and documented in the EPIC chart. Divorced. Retired. 2 daughters. Daughter with history of breast cancer 5 years ago.   ROS:  A ROS was performed and pertinent positives and negatives are included.  Exam:  Vitals:   12/06/21 0824  BP: 132/80  Weight: 151 lb (68.5 kg)  Height: 5\' 3"  (1.6 m)    Body mass index is 26.75 kg/m.  General appearance:  Normal Thyroid:  Symmetrical, normal in size, without palpable masses or nodularity. Respiratory  Auscultation:  Clear without wheezing or rhonchi Cardiovascular  Auscultation:  Regular rate, without rubs, murmurs or gallops  Edema/varicosities:  Not grossly evident Abdominal  Soft,nontender, without masses, guarding or rebound.  Liver/spleen:  No organomegaly noted  Hernia:  None appreciated  Skin  Inspection:  Grossly normal Breasts: Examined lying and sitting.   Right: Without masses, retractions, nipple discharge or axillary adenopathy.   Left: Without masses, retractions, nipple discharge or axillary adenopathy. Genitourinary   Inguinal/mons:  Normal without inguinal adenopathy  External genitalia:  Normal appearing vulva with no masses, tenderness, or lesions  BUS/Urethra/Skene's glands:  Normal  Vagina:  Normal  appearing with normal color and discharge, no lesions. Atrophic changes  Cervix:  Absent  Uterus:  Absent  Adnexa/parametria:     Rt: Normal in size, without masses or tenderness.   Lt: Normal in size, without masses or tenderness.  Anus and perineum: Normal  Digital rectal exam: Normal sphincter tone without palpated masses or tenderness  Patient informed chaperone available to be present for breast and pelvic exam. Patient has requested no chaperone to be present. Patient has been advised what will be completed during breast and pelvic exam.   Assessment/Plan:  71 y.o. G 2P2002 for breast and pelvic exam.   Well female exam with routine gynecological exam - Education provided on SBEs, importance of preventative screenings, current guidelines, high calcium diet, regular exercise, and multivitamin daily. Labs done with PCP.   Postmenopausal - Plan: DG Bone Density. No HRT. S/P TAH BSO  Screening for cervical cancer - Normal Pap history.  No longer screening per guidelines.   Screening for breast cancer - Normal mammogram history.  Continue annual screenings.  Normal breast exam today.   Screening for colon cancer - 2019 colonoscopy. Will repeat at 5-year interval per GI's recommendation.  Screening for osteoporosis - Normal DXA April 2018. Recommend Vitamin D supplement and regular exercise. Will repeat DXA now.   Return in 1 year for breast and pelvic exam.     May 2018 DNP, 8:54 AM 12/06/2021

## 2021-12-06 ENCOUNTER — Ambulatory Visit (INDEPENDENT_AMBULATORY_CARE_PROVIDER_SITE_OTHER): Payer: Medicare Other | Admitting: Nurse Practitioner

## 2021-12-06 ENCOUNTER — Encounter: Payer: Self-pay | Admitting: Nurse Practitioner

## 2021-12-06 VITALS — BP 132/80 | Ht 63.0 in | Wt 151.0 lb

## 2021-12-06 DIAGNOSIS — Z9189 Other specified personal risk factors, not elsewhere classified: Secondary | ICD-10-CM

## 2021-12-06 DIAGNOSIS — Z780 Asymptomatic menopausal state: Secondary | ICD-10-CM

## 2021-12-06 DIAGNOSIS — Z01419 Encounter for gynecological examination (general) (routine) without abnormal findings: Secondary | ICD-10-CM

## 2021-12-07 DIAGNOSIS — L509 Urticaria, unspecified: Secondary | ICD-10-CM | POA: Diagnosis not present

## 2021-12-13 DIAGNOSIS — I10 Essential (primary) hypertension: Secondary | ICD-10-CM | POA: Diagnosis not present

## 2021-12-17 DIAGNOSIS — Z1231 Encounter for screening mammogram for malignant neoplasm of breast: Secondary | ICD-10-CM | POA: Diagnosis not present

## 2021-12-18 ENCOUNTER — Encounter: Payer: Self-pay | Admitting: Nurse Practitioner

## 2021-12-22 ENCOUNTER — Encounter: Payer: Self-pay | Admitting: *Deleted

## 2021-12-22 LAB — GLUCOSE, POCT (MANUAL RESULT ENTRY): POC Glucose: 85 mg/dl (ref 70–99)

## 2021-12-22 NOTE — Progress Notes (Signed)
Patient tolerated screening well today. Patient received information regarding CBG/BP.  

## 2021-12-31 ENCOUNTER — Emergency Department (HOSPITAL_COMMUNITY): Payer: Medicare Other

## 2021-12-31 ENCOUNTER — Other Ambulatory Visit: Payer: Self-pay

## 2021-12-31 ENCOUNTER — Encounter (HOSPITAL_COMMUNITY): Payer: Self-pay

## 2021-12-31 ENCOUNTER — Emergency Department (HOSPITAL_COMMUNITY)
Admission: EM | Admit: 2021-12-31 | Discharge: 2022-01-01 | Disposition: A | Discharge status: Home / Self Care | Payer: Medicare Other | Attending: Emergency Medicine | Admitting: Emergency Medicine

## 2021-12-31 DIAGNOSIS — Z79899 Other long term (current) drug therapy: Secondary | ICD-10-CM | POA: Diagnosis not present

## 2021-12-31 DIAGNOSIS — R519 Headache, unspecified: Secondary | ICD-10-CM | POA: Diagnosis not present

## 2021-12-31 DIAGNOSIS — I951 Orthostatic hypotension: Secondary | ICD-10-CM | POA: Insufficient documentation

## 2021-12-31 DIAGNOSIS — R55 Syncope and collapse: Secondary | ICD-10-CM

## 2021-12-31 DIAGNOSIS — R42 Dizziness and giddiness: Secondary | ICD-10-CM | POA: Diagnosis present

## 2021-12-31 DIAGNOSIS — Z7982 Long term (current) use of aspirin: Secondary | ICD-10-CM | POA: Insufficient documentation

## 2021-12-31 DIAGNOSIS — I10 Essential (primary) hypertension: Secondary | ICD-10-CM | POA: Insufficient documentation

## 2021-12-31 DIAGNOSIS — R002 Palpitations: Secondary | ICD-10-CM | POA: Diagnosis not present

## 2021-12-31 LAB — CBC WITH DIFFERENTIAL/PLATELET
Abs Immature Granulocytes: 0.02 10*3/uL (ref 0.00–0.07)
Basophils Absolute: 0 10*3/uL (ref 0.0–0.1)
Basophils Relative: 0 %
Eosinophils Absolute: 0.2 10*3/uL (ref 0.0–0.5)
Eosinophils Relative: 3 %
HCT: 41.6 % (ref 36.0–46.0)
Hemoglobin: 13 g/dL (ref 12.0–15.0)
Immature Granulocytes: 0 %
Lymphocytes Relative: 38 %
Lymphs Abs: 3 10*3/uL (ref 0.7–4.0)
MCH: 29 pg (ref 26.0–34.0)
MCHC: 31.3 g/dL (ref 30.0–36.0)
MCV: 92.7 fL (ref 80.0–100.0)
Monocytes Absolute: 0.7 10*3/uL (ref 0.1–1.0)
Monocytes Relative: 9 %
Neutro Abs: 3.9 10*3/uL (ref 1.7–7.7)
Neutrophils Relative %: 50 %
Platelets: 279 10*3/uL (ref 150–400)
RBC: 4.49 MIL/uL (ref 3.87–5.11)
RDW: 12.9 % (ref 11.5–15.5)
WBC: 7.8 10*3/uL (ref 4.0–10.5)
nRBC: 0 % (ref 0.0–0.2)

## 2021-12-31 LAB — COMPREHENSIVE METABOLIC PANEL
ALT: 11 U/L (ref 0–44)
AST: 15 U/L (ref 15–41)
Albumin: 3.6 g/dL (ref 3.5–5.0)
Alkaline Phosphatase: 66 U/L (ref 38–126)
Anion gap: 8 (ref 5–15)
BUN: 13 mg/dL (ref 8–23)
CO2: 24 mmol/L (ref 22–32)
Calcium: 9.1 mg/dL (ref 8.9–10.3)
Chloride: 103 mmol/L (ref 98–111)
Creatinine, Ser: 1.2 mg/dL — ABNORMAL HIGH (ref 0.44–1.00)
GFR, Estimated: 48 mL/min — ABNORMAL LOW (ref >60)
Glucose, Bld: 150 mg/dL — ABNORMAL HIGH (ref 70–99)
Potassium: 4.3 mmol/L (ref 3.5–5.1)
Sodium: 135 mmol/L (ref 135–145)
Total Bilirubin: 0.7 mg/dL (ref 0.3–1.2)
Total Protein: 6.5 g/dL (ref 6.5–8.1)

## 2021-12-31 LAB — TROPONIN I (HIGH SENSITIVITY)
Troponin I (High Sensitivity): 3 ng/L (ref <18)
Troponin I (High Sensitivity): 5 ng/L (ref <18)

## 2021-12-31 LAB — D-DIMER, QUANTITATIVE: D-Dimer, Quant: 0.37 ug/mL-FEU (ref 0.00–0.50)

## 2021-12-31 MED ORDER — LACTATED RINGERS IV BOLUS
1000.0000 mL | Freq: Once | INTRAVENOUS | Status: AC
  Administered 2021-12-31: 1000 mL via INTRAVENOUS

## 2021-12-31 MED ORDER — ACETAMINOPHEN 500 MG PO TABS
1000.0000 mg | ORAL_TABLET | Freq: Once | ORAL | Status: AC
  Administered 2021-12-31: 1000 mg via ORAL
  Filled 2021-12-31: qty 2

## 2021-12-31 NOTE — ED Provider Notes (Signed)
[facility] EMERGENCY DEPARTMENT Provider Note   CSN: [id number] Arrival date & time: 12/31/21  1803     History  Chief Complaint  Patient presents with   Near Syncope    Candace Baker is a 71 y.o. female.   Near Syncope     71 year old female with a history of hypertension presenting to the emergency department with a chief complaint of lightheadedness.  The patient denies any loss of consciousness.  She states that she has been feeling palpitations with variations in her heart rate. She denies any neurologic symptoms. She denies chest pain or SOB. She endorses compliance with her antihypertensives.   Home Medications Prior to Admission medications   Medication Sig Start Date End Date Taking? Authorizing Provider  acyclovir (ZOVIRAX) 400 MG tablet TAKE ONE TABLET BY MOUTH TWICE DAILY FOR 3-5 DAYS THEN DAILY AS NEEDED. 07/30/21   Wyline Beady A, NP  aspirin 81 MG tablet Take 81 mg by mouth.    [provider]  cloNIDine (CATAPRES) 0.1 MG tablet Take 1 tablet by mouth in the morning and at bedtime. 12/13/21   [provider]  glipiZIDE (GLUCOTROL XL) 5 MG 24 hr tablet Take 5 mg by mouth daily. 09/21/18   [provider]  JANUVIA 100 MG tablet Take 100 mg by mouth daily. 08/25/21   [provider]  JARDIANCE 25 MG TABS tablet Take 25 mg by mouth daily. 12/08/21   [provider]  metFORMIN (GLUMETZA) 1000 MG (MOD) 24 hr tablet Take 1,000 mg by mouth 2 (two) times daily with a meal.    [provider]  metoprolol succinate (TOPROL-XL) 50 MG 24 hr tablet Take 0.5 tablets (25 mg total) by mouth daily. 01/01/22   Dyann Kief, PA-C  Multiple Vitamins-Minerals (WOMENS 50+ ADVANCED) CAPS See admin instructions.    [provider]  rosuvastatin (CRESTOR) 20 MG tablet Take 1 tablet (20 mg total) by mouth daily. 01/03/22   Dyann Kief, PA-C  telmisartan (MICARDIS) 80 MG tablet Take 80 mg by mouth.     [provider]      Allergies    Tiazac [diltiazem hcl]    Review of Systems   Review of Systems  Cardiovascular:  Positive for near-syncope.  All other systems reviewed and are negative.   Physical Exam Updated Vital Signs BP (!) 156/69   Pulse 64   Temp 98.3 F (36.8 C) (Oral)   Resp 18   Ht 5\' 3"  (1.6 m)   Wt 67.1 kg   SpO2 98%   BMI 26.22 kg/m  Physical Exam Vitals and nursing note reviewed.  Constitutional:      General: She is not in acute distress.    Appearance: She is well-developed.  HENT:     Head: Normocephalic and atraumatic.  Eyes:     Conjunctiva/sclera: Conjunctivae normal.  Cardiovascular:     Rate and Rhythm: Normal rate and regular rhythm.     Pulses: Normal pulses.  Pulmonary:     Effort: Pulmonary effort is normal. No respiratory distress.     Breath sounds: Normal breath sounds.  Abdominal:     Palpations: Abdomen is soft.     Tenderness: There is no abdominal tenderness.  Musculoskeletal:        General: No swelling.     Cervical back: Neck supple.     Right lower leg: No edema.     Left lower leg: No edema.  Skin:  General: Skin is warm and dry.     Capillary Refill: Capillary refill takes less than 2 seconds.  Neurological:     General: No focal deficit present.     Mental Status: She is alert and oriented to person, place, and time.     Cranial Nerves: No cranial nerve deficit.     Sensory: No sensory deficit.     Motor: No weakness.  Psychiatric:        Mood and Affect: Mood normal.     ED Results / Procedures / Treatments   Labs (all labs ordered are listed, but only abnormal results are displayed) Labs Reviewed  COMPREHENSIVE METABOLIC PANEL - Abnormal; Notable for the following components:      Result Value   Glucose, Bld 150 (*)    Creatinine, Ser 1.20 (*)    GFR, Estimated 48 (*)    All other components within normal limits  CBC WITH DIFFERENTIAL/PLATELET  D-DIMER, QUANTITATIVE  TROPONIN I (HIGH  SENSITIVITY)  TROPONIN I (HIGH SENSITIVITY)    EKG EKG Interpretation  Date/Time:  Monday December 31 2021 20:40:46 EDT Ventricular Rate:  61 PR Interval:  192 QRS Duration: 91 QT Interval:  417 QTC Calculation: 420 R Axis:   19 Text Interpretation: Sinus rhythm Low voltage, precordial leads Confirmed by Ernie Avena ([id number]) on 12/31/2021 9:46:59 PM  Radiology No results found.  Procedures Procedures    Medications Ordered in ED Medications  lactated ringers bolus 1,000 mL (0 mLs Intravenous Stopped 12/31/21 2248)  acetaminophen (TYLENOL) tablet 1,000 mg (1,000 mg Oral Given 12/31/21 2248)    ED Course/ Medical Decision Making/ A&P                           Medical Decision Making Amount and/or Complexity of Data Reviewed Labs: ordered.  Risk OTC drugs.   71 year old female with a history of hypertension presenting to the emergency department with a chief complaint of lightheadedness.  The patient denies any loss of consciousness.  She states that she has been feeling palpitations with variations in her heart rate. She denies any neurologic symptoms. She denies chest pain or SOB. She endorses compliance with her antihypertensives.   Candace Baker is a 71 y.o. female who presents with presyncope as per above.  Currently, Candace Baker is awake, alert, and hemodynamically stable.  I am most concerned for orthostatic hypotension vs vasovagal syncope. I do not think that this is due to ACS, PE, dysrhythmia, electrolyte abnormality, heart valve abnormality such as aortic stenosis, HOCM, pneumothorax, aortic dissection, ruptured AAA, anemia, hypoglycemia, seizure, stroke, hypovolemia.  On exam, she has intact distal pulses in all extremities, normal capillary refill, a regular heart rate, and normal breath sounds. There is no carotid bruit, unusual abdominal mass, or heart murmur.  As per above, there are no focal neurologic deficits on exam and the patient can ambulate without  difficultly.  As per above, the ECG reveals no evidence of acute ischemia, dysrhythmia, or syncope-associated finding. The CXR was both interpreted by radiology and by myself. There is no evidence of cardiomegaly, pneumonia, pneumothorax, mediastinal widening, acute volume overload, fracture, or other acute, emergent intrathoracic pathology.    Labs reveal no acute abnormalities. Specifically,  initial troponin and d-dimer are not elevated.  CT Head unremarkable and her neuro exam is normal. CXR reviewed by myself and interpreted by myself and radiology negative for active disease. Cr has ranged between 0.8 and  1.2. Pt administered an IVF bolus and tylenol for headache. Negative red flag symptoms for headache, endorses mild tension type headache currently. Considered dehydration with elevated serum creatinine with concern for orthostatic hypotension vs vasovagal near syncope as symptoms came on when she stood up to get up from the couch. Discussed routine follow-up with cardiology.   I believe that the patient is stable for discharge based on her reassuring evaluation. At the time of discharge, her vitals were appropriate and she was able to ambulate without difficultly. I provided ED return precautions. The patient agreed to followup with her cardiologist as needed.   Final Clinical Impression(s) / ED Diagnoses Final diagnoses:  Near syncope  Orthostatic hypotension    Rx / DC Orders ED Discharge Orders     None         Ernie Avena, MD 01/07/22 1018

## 2021-12-31 NOTE — ED Provider Triage Note (Signed)
Emergency Medicine Provider Triage Evaluation Note  Candace Baker , a 71 y.o. female  was evaluated in triage.  Pt complains of lightheadedness, palpitations.  Symptoms began approximately 2 hours ago.  Reports right-sided headache.  Denies any numbness or weakness in extremities.  No changes to speech.  No blurry vision.  Reports feeling like her heart is skipping a beat.  Has been compliant with her antihypertensives but admits that it has been difficult to control her high blood pressure for several years.  Review of Systems  Positive: Lightheadedness, palpitations Negative: Vision changes  Physical Exam  BP (!) 210/90 (BP Location: Right Arm)   Pulse 71   Temp 97.9 F (36.6 C) (Oral)   Resp 15   SpO2 100%  Gen:   Awake, no distress   Resp:  Normal effort  MSK:   Moves extremities without difficulty  Other:  Strength 5/5 in bilateral upper and lower extremities pupil and reactive to light bilaterally.  Normal speech.  No facial asymmetry.  Medical Decision Making  Medically screening exam initiated at 6:29 PM.  Appropriate orders placed.  DEL GIM was informed that the remainder of the evaluation will be completed by another provider, this initial triage assessment does not replace that evaluation, and the importance of remaining in the ED until their evaluation is complete.  Imaging and lab work ordered   Dietrich Pates, [place] 12/31/21 1831

## 2022-01-01 ENCOUNTER — Ambulatory Visit: Payer: Medicare Other | Admitting: Physician Assistant

## 2022-01-01 ENCOUNTER — Encounter: Payer: Self-pay | Admitting: Physician Assistant

## 2022-01-01 VITALS — BP 128/72 | HR 73 | Ht 63.5 in | Wt 147.2 lb

## 2022-01-01 DIAGNOSIS — I10 Essential (primary) hypertension: Secondary | ICD-10-CM | POA: Diagnosis not present

## 2022-01-01 DIAGNOSIS — I493 Ventricular premature depolarization: Secondary | ICD-10-CM | POA: Diagnosis not present

## 2022-01-01 DIAGNOSIS — E785 Hyperlipidemia, unspecified: Secondary | ICD-10-CM | POA: Diagnosis not present

## 2022-01-01 DIAGNOSIS — I700 Atherosclerosis of aorta: Secondary | ICD-10-CM

## 2022-01-01 DIAGNOSIS — I491 Atrial premature depolarization: Secondary | ICD-10-CM

## 2022-01-01 DIAGNOSIS — R42 Dizziness and giddiness: Secondary | ICD-10-CM

## 2022-01-01 MED ORDER — METOPROLOL SUCCINATE ER 50 MG PO TB24: 25.0000 mg | ORAL_TABLET | Freq: Every day | ORAL | 3 refills | Status: DC

## 2022-01-02 ENCOUNTER — Ambulatory Visit
Admission: RE | Admit: 2022-01-02 | Discharge: 2022-01-02 | Disposition: A | Discharge status: Home / Self Care | Payer: Self-pay | Source: Ambulatory Visit | Attending: Physician Assistant | Admitting: Physician Assistant

## 2022-01-02 DIAGNOSIS — I700 Atherosclerosis of aorta: Secondary | ICD-10-CM

## 2022-01-02 DIAGNOSIS — E785 Hyperlipidemia, unspecified: Secondary | ICD-10-CM

## 2022-01-03 ENCOUNTER — Other Ambulatory Visit: Payer: Self-pay

## 2022-01-03 DIAGNOSIS — E785 Hyperlipidemia, unspecified: Secondary | ICD-10-CM

## 2022-01-03 MED ORDER — ROSUVASTATIN CALCIUM 20 MG PO TABS: 20.0000 mg | ORAL_TABLET | Freq: Every day | ORAL | 3 refills | Status: DC

## 2022-01-14 DIAGNOSIS — E1165 Type 2 diabetes mellitus with hyperglycemia: Secondary | ICD-10-CM | POA: Diagnosis not present

## 2022-01-14 DIAGNOSIS — I10 Essential (primary) hypertension: Secondary | ICD-10-CM | POA: Diagnosis not present

## 2022-01-14 DIAGNOSIS — E7800 Pure hypercholesterolemia, unspecified: Secondary | ICD-10-CM | POA: Diagnosis not present

## 2022-01-15 ENCOUNTER — Ambulatory Visit (INDEPENDENT_AMBULATORY_CARE_PROVIDER_SITE_OTHER): Payer: Medicare Other

## 2022-01-15 ENCOUNTER — Other Ambulatory Visit: Payer: Self-pay | Admitting: Nurse Practitioner

## 2022-01-15 DIAGNOSIS — Z13820 Encounter for screening for osteoporosis: Secondary | ICD-10-CM

## 2022-01-15 DIAGNOSIS — Z780 Asymptomatic menopausal state: Secondary | ICD-10-CM | POA: Diagnosis not present

## 2022-01-18 DIAGNOSIS — I10 Essential (primary) hypertension: Secondary | ICD-10-CM | POA: Diagnosis not present

## 2022-01-26 ENCOUNTER — Other Ambulatory Visit: Payer: Self-pay | Admitting: Cardiology

## 2022-01-31 ENCOUNTER — Other Ambulatory Visit: Payer: Self-pay | Admitting: Cardiology

## 2022-03-07 ENCOUNTER — Encounter: Payer: Self-pay | Admitting: Cardiology

## 2022-03-07 ENCOUNTER — Ambulatory Visit: Payer: Medicare Other | Attending: Cardiology | Admitting: Cardiology

## 2022-03-07 VITALS — BP 120/70 | HR 60 | Ht 63.5 in | Wt 144.0 lb

## 2022-03-07 DIAGNOSIS — I491 Atrial premature depolarization: Secondary | ICD-10-CM | POA: Diagnosis not present

## 2022-03-07 DIAGNOSIS — E785 Hyperlipidemia, unspecified: Secondary | ICD-10-CM | POA: Diagnosis not present

## 2022-03-07 DIAGNOSIS — R002 Palpitations: Secondary | ICD-10-CM | POA: Diagnosis not present

## 2022-03-07 NOTE — Patient Instructions (Signed)
Medication Instructions:  The current medical regimen is effective;  continue present plan and medications.  *If you need a refill on your cardiac medications before your next appointment, please call your pharmacy*  Follow-Up: At [facility], you and your health needs are our priority.  As part of our continuing mission to provide you with exceptional heart care, we have created designated Provider Care Teams.  These Care Teams include your primary Cardiologist (physician) and Advanced Practice Providers (APPs -  Physician Assistants and Nurse Practitioners) who all work together to provide you with the care you need, when you need it.  We recommend signing up for the patient portal called "MyChart".  Sign up information is provided on this After Visit Summary.  MyChart is used to connect with patients for Virtual Visits (Telemedicine).  Patients are able to view lab/test results, encounter notes, upcoming appointments, etc.  Non-urgent messages can be sent to your provider as well.   To learn more about what you can do with MyChart, go to [email or web address].    Your next appointment:   1 year(s)  The format for your next appointment:   In Person  Provider:   Mark Skains, MD      Important Information About Sugar       

## 2022-03-07 NOTE — Progress Notes (Signed)
Cardiology Office Note:    Date:  03/07/2022   ID:  Candace, Baker 1950-09-17, MRN [id number]  PCP:  Daisy Floro, MD   [facility] Providers Cardiologist:  Donato Schultz, MD     Referring MD: Daisy Floro, MD    History of Present Illness:    Candace Baker is a 71 y.o. female here for 2 month follow up dizziness.  She states she is not having any more issues with this.  Excellent.  HTN, DM HLD, palpitations PVCs PACs, aortic atherosclerosis previously seen on CT  Coronary calcium score was performed as below.  Crestor was increased to 20 mg.  Has been tolerating well.  Has upcoming lab work.  She is enjoying bowling.  Previously was having random bouts of dizziness feeling her heart rate slowing down does not seem to be related to posture.  She thought that the symptoms were because she was off of the HCTZ. Patient said when she got up from couch she walked to the kitchen and became dizzy and felt like her heart rate was slow. She had a headache and her BP was 209/91. Never felt her heart race. Has had several times since metoprolol increased. Denies chest pain. Prior EKG demonstrated normal sinus rhythm D-dimer was negative troponins were negative TSH was normal.  Possible dehydration with increased creatinine.  Telemetry in the hospital setting was normal.    Echocardiogram demonstrated EF of 65% with grade 1 diastolic dysfunction. Perhaps vagal induced symptoms with dehydration.    Event monitor was performed and showed occasional PVCs and PACs with symptoms of flutters.  No SVT detected.  Overall reassuring.  Occasionally she will feel some skips.   Past Medical History:  Diagnosis Date   Diabetes mellitus    Elevated cholesterol    Endometriosis    Gallstones    Hypertension    STD (sexually transmitted disease)    HSV    Past Surgical History:  Procedure Laterality Date   ABDOMINAL HYSTERECTOMY  1989   TAH    CHOLECYSTECTOMY      OOPHORECTOMY  1991   BSO   TONSILLECTOMY      Current Medications: Current Meds  Medication Sig   acyclovir (ZOVIRAX) 400 MG tablet TAKE ONE TABLET BY MOUTH TWICE DAILY FOR 3-5 DAYS THEN DAILY AS NEEDED.   aspirin 81 MG tablet Take 81 mg by mouth.   cloNIDine (CATAPRES) 0.1 MG tablet Take 1 tablet by mouth in the morning and at bedtime.   glipiZIDE (GLUCOTROL XL) 5 MG 24 hr tablet Take 5 mg by mouth daily.   JARDIANCE 25 MG TABS tablet Take 25 mg by mouth daily.   metFORMIN (GLUCOPHAGE-XR) 500 MG 24 hr tablet Take 500 mg by mouth 2 (two) times daily.   metoprolol succinate (TOPROL-XL) 50 MG 24 hr tablet TAKE 1 TABLET BY MOUTH EVERY DAY   Multiple Vitamins-Minerals (WOMENS 50+ ADVANCED) CAPS See admin instructions.   rosuvastatin (CRESTOR) 20 MG tablet Take 1 tablet (20 mg total) by mouth daily.   telmisartan (MICARDIS) 80 MG tablet Take 80 mg by mouth.     Allergies:   Tiazac [diltiazem hcl]   Social History   Socioeconomic History   Marital status: Divorced    Spouse name: Not on file   Number of children: Not on file   Years of education: Not on file   Highest education level: Not on file  Occupational History   Not on file  Tobacco Use  Smoking status: Never   Smokeless tobacco: Never  Vaping Use   Vaping Use: Never used  Substance and Sexual Activity   Alcohol use: Yes    Alcohol/week: 0.0 standard drinks of alcohol    Comment: Rare   Drug use: Never   Sexual activity: Yes    Birth control/protection: Surgical    Comment: 1st intercourse 71 yo-More than 5 partners  Other Topics Concern   Not on file  Social History Narrative   Not on file   Social Determinants of Health   Financial Resource Strain: Not on file  Food Insecurity: Not on file  Transportation Needs: Not on file  Physical Activity: Not on file  Stress: Not on file  Social Connections: Not on file     Family History: The patient's family history includes Heart disease in her paternal  grandfather; Hypertension in her maternal grandfather; Sarcoidosis in her mother.  ROS:   Please see the history of present illness.    No syncope no bleeding no orthopnea no PND all other systems reviewed and are negative.  EKGs/Labs/Other Studies Reviewed:    The following studies were reviewed today:  Coronary calcium score 01/02/2022: Coronary calcium score of 167. This was 80th percentile for age-, race-, and sex-matched controls.  Monitor 11/15/20 Occasional PVCs/PACs noted. These were often accompanied with sensation of flutters Sinus rhythm predominant rhythm, sinus tachycardia occasionally. No adverse arrhythmias. No pauses No atrial fibrillation No ventricular tachycardia No evidence of supraventricular tachycardia   Overall reassuring monitor.  Symptoms correlate with occasional PVCs or PACs.  Benign.  Continue with medical management.  Reassurance.  Discussed at clinic visit. Donato Schultz, MD   Echo 09/2018 IMPRESSIONS     1. The left ventricle has normal systolic function with an ejection  fraction of 60-65%. The cavity size was normal. Left ventricular diastolic  Doppler parameters are consistent with impaired relaxation.   2. The right ventricle has normal systolic function. The cavity was  normal. There is no increase in right ventricular wall thickness.   3. The pericardial effusion is posterior to the left ventricle.   4. Trivial pericardial effusion is present.   5. The aortic valve is tricuspid.   6. The aortic root and ascending aorta are normal in size and structure.   Recent Labs: 12/31/2021: ALT 11; BUN 13; Creatinine, Ser 1.20; Hemoglobin 13.0; Platelets 279; Potassium 4.3; Sodium 135  Recent Lipid Panel No results found for: "CHOL", "TRIG", "HDL", "CHOLHDL", "VLDL", "LDLCALC", "LDLDIRECT"   Risk Assessment/Calculations:              Physical Exam:    VS:  BP 120/70 (BP Location: Left Arm, Patient Position: Sitting, Cuff Size: Normal)   Pulse  60   Ht 5' 3.5" (1.613 m)   Wt 144 lb (65.3 kg)   SpO2 98%   BMI 25.11 kg/m     Wt Readings from Last 3 Encounters:  03/07/22 144 lb (65.3 kg)  01/01/22 147 lb 3.2 oz (66.8 kg)  12/31/21 148 lb (67.1 kg)     GEN:  Well nourished, well developed in no acute distress HEENT: Normal NECK: No JVD; No carotid bruits LYMPHATICS: No lymphadenopathy CARDIAC: RRR, no murmurs, no rubs, gallops RESPIRATORY:  Clear to auscultation without rales, wheezing or rhonchi  ABDOMEN: Soft, non-tender, non-distended MUSCULOSKELETAL:  No edema; No deformity  SKIN: Warm and dry NEUROLOGIC:  Alert and oriented x 3 PSYCHIATRIC:  Normal affect   ASSESSMENT:    1. Hyperlipidemia, unspecified hyperlipidemia  type   2. PAC (premature atrial contraction)   3. Palpitations    PLAN:    In order of problems listed above:  Dizziness with presyncope with history of PVC/PAC  ED- labs, EKG stable, symptoms worse since Torpol xl increased to 50 mg daily for HTN in March. Decrease toprol xl 25 mg daily at prior visit. If still having symptoms place ZIO. Should have TSH check next month with A1C   DM with HTN 2 gm sodium diet-eats out a lot   Aortoiliac atherosclerosis Coronary artery disease/calcification Originally seen on CT 2012.  Coronary calcium score as above.  Continue to modify risk factors.   HLD  Previously on crestor 10 mg LDL 80 06/2021.  Increased Crestor to 20 mg once a day to help boost LDL lowering to less than 70 given her coronary artery calcification.  This was done in late June 2023.  She has upcoming lab 2 months following change.  She is not having any myalgias.  Doing well.      Medication Adjustments/Labs and Tests Ordered: Current medicines are reviewed at length with the patient today.  Concerns regarding medicines are outlined above.  No orders of the defined types were placed in this encounter.  No orders of the defined types were placed in this encounter.   Patient  Instructions  Medication Instructions:  The current medical regimen is effective;  continue present plan and medications.  *If you need a refill on your cardiac medications before your next appointment, please call your pharmacy*   Follow-Up: At [facility], you and your health needs are our priority.  As part of our continuing mission to provide you with exceptional heart care, we have created designated Provider Care Teams.  These Care Teams include your primary Cardiologist (physician) and Advanced Practice Providers (APPs -  Physician Assistants and Nurse Practitioners) who all work together to provide you with the care you need, when you need it.  We recommend signing up for the patient portal called "MyChart".  Sign up information is provided on this After Visit Summary.  MyChart is used to connect with patients for Virtual Visits (Telemedicine).  Patients are able to view lab/test results, encounter notes, upcoming appointments, etc.  Non-urgent messages can be sent to your provider as well.   To learn more about what you can do with MyChart, go to [email or web address].    Your next appointment:   1 year(s)  The format for your next appointment:   In Person  Provider:   Candee Furbish, MD     Important Information About Sugar         Signed, Candee Furbish, MD  03/07/2022 10:16 AM    [place]

## 2022-04-05 ENCOUNTER — Ambulatory Visit: Payer: Medicare Other | Attending: Physician Assistant

## 2022-04-05 DIAGNOSIS — E785 Hyperlipidemia, unspecified: Secondary | ICD-10-CM | POA: Diagnosis not present

## 2022-04-05 LAB — LIPID PANEL
Chol/HDL Ratio: 2.7 ratio (ref 0.0–4.4)
Cholesterol, Total: 131 mg/dL (ref 100–199)
HDL: 48 mg/dL (ref >39)
LDL Chol Calc (NIH): 70 mg/dL (ref 0–99)
Triglycerides: 62 mg/dL (ref 0–149)
VLDL Cholesterol Cal: 13 mg/dL (ref 5–40)

## 2022-04-08 ENCOUNTER — Telehealth: Payer: Self-pay | Admitting: Cardiology

## 2022-04-08 DIAGNOSIS — E7800 Pure hypercholesterolemia, unspecified: Secondary | ICD-10-CM

## 2022-04-08 MED ORDER — ROSUVASTATIN CALCIUM 40 MG PO TABS: 40.0000 mg | ORAL_TABLET | Freq: Every day | ORAL | 3 refills | Status: DC

## 2022-04-08 NOTE — Telephone Encounter (Signed)
Patient is returning call to discuss lab results. 

## 2022-04-08 NOTE — Telephone Encounter (Signed)
Spoke with patient. Reviewed labs and advised to increase Crestor to 40mg  daily. Patient verbalized understanding and had not further questions.

## 2022-04-08 NOTE — Addendum Note (Signed)
Addended by: Vergia Alcon A on: 04/08/2022 05:29 PM   Modules accepted: Orders

## 2022-07-10 ENCOUNTER — Ambulatory Visit: Payer: Medicare Other | Attending: Physician Assistant

## 2022-07-10 DIAGNOSIS — E7800 Pure hypercholesterolemia, unspecified: Secondary | ICD-10-CM

## 2022-07-10 LAB — LIPID PANEL
Chol/HDL Ratio: 2.6 ratio (ref 0.0–4.4)
Cholesterol, Total: 116 mg/dL (ref 100–199)
HDL: 44 mg/dL (ref >39)
LDL Chol Calc (NIH): 60 mg/dL (ref 0–99)
Triglycerides: 52 mg/dL (ref 0–149)
VLDL Cholesterol Cal: 12 mg/dL (ref 5–40)

## 2022-09-14 ENCOUNTER — Other Ambulatory Visit: Payer: Self-pay | Admitting: Nurse Practitioner

## 2022-09-16 NOTE — Telephone Encounter (Signed)
Med refill request:acyclovir 400 mg tab twice daily for 3-5 days then daily as needed Last AEX: 12/06/21 -TW Next AEX: 12/09/22 -TW Last MMG (if hormonal med) N/A   Refill authorized: Please Advise?  Routing to [place], NP

## 2022-10-02 ENCOUNTER — Other Ambulatory Visit: Payer: Self-pay | Admitting: Cardiology

## 2022-11-11 ENCOUNTER — Other Ambulatory Visit: Payer: Self-pay

## 2022-11-11 MED ORDER — ROSUVASTATIN CALCIUM 40 MG PO TABS: 40.0000 mg | ORAL_TABLET | Freq: Every day | ORAL | 1 refills | Status: DC

## 2022-11-11 NOTE — Telephone Encounter (Signed)
Pt's medication was sent to pt's pharmacy as requested. Confirmation received.  °

## 2022-12-09 ENCOUNTER — Ambulatory Visit (INDEPENDENT_AMBULATORY_CARE_PROVIDER_SITE_OTHER): Payer: Medicare Other | Admitting: Nurse Practitioner

## 2022-12-09 ENCOUNTER — Encounter: Payer: Self-pay | Admitting: Nurse Practitioner

## 2022-12-09 VITALS — BP 132/70 | HR 68 | Ht 63.0 in | Wt 140.0 lb

## 2022-12-09 DIAGNOSIS — Z780 Asymptomatic menopausal state: Secondary | ICD-10-CM

## 2022-12-09 DIAGNOSIS — Z9189 Other specified personal risk factors, not elsewhere classified: Secondary | ICD-10-CM

## 2022-12-09 DIAGNOSIS — B009 Herpesviral infection, unspecified: Secondary | ICD-10-CM | POA: Diagnosis not present

## 2022-12-09 DIAGNOSIS — Z01419 Encounter for gynecological examination (general) (routine) without abnormal findings: Secondary | ICD-10-CM

## 2022-12-09 NOTE — Progress Notes (Signed)
   LEYNA MEW [birth year] [id number]   History:  72 y.o. G2P2002 presents for breast and pelvic exam. No GYN complaints. Postmenopausal - no HRT. S/P 1989 TAH for endometriosis, subsequent BSO 1991. Normal pap and mammogram history. HTN managed by cardiology, DM managed by endocrinology.  Gynecologic History No LMP recorded. Patient has had a hysterectomy.   Contraception: status post hysterectomy Sexually active: Yes  Health Maintenance Last Pap: 12/18/2010. Results were: Normal Last mammogram: 12/17/2021. Results were: Normal Last colonoscopy: 2024. Results were: Normal, 5-year recall Last Dexa: 01/15/2022. Results were: Normal  Past medical history, past surgical history, family history and social history were all reviewed and documented in the EPIC chart. Divorced. Retired. 2 daughters. Daughter with history of breast cancer at age 89.   ROS:  A ROS was performed and pertinent positives and negatives are included.  Exam:  Vitals:   12/09/22 0820  BP: 132/70  Pulse: 68  SpO2: 100%  Weight: 140 lb (63.5 kg)  Height: 5\' 3"  (1.6 m)     Body mass index is 24.8 kg/m.  General appearance:  Normal Thyroid:  Symmetrical, normal in size, without palpable masses or nodularity. Respiratory  Auscultation:  Clear without wheezing or rhonchi Cardiovascular  Auscultation:  Regular rate, without rubs, murmurs or gallops  Edema/varicosities:  Not grossly evident Abdominal  Soft,nontender, without masses, guarding or rebound.  Liver/spleen:  No organomegaly noted  Hernia:  None appreciated  Skin  Inspection:  Grossly normal Breasts: Examined lying and sitting.   Right: Without masses, retractions, nipple discharge or axillary adenopathy.   Left: Without masses, retractions, nipple discharge or axillary adenopathy. Genitourinary   Inguinal/mons:  Normal without inguinal adenopathy  External genitalia:  Normal appearing vulva with no masses, tenderness, or  lesions  BUS/Urethra/Skene's glands:  Normal  Vagina:  Normal appearing with normal color and discharge, no lesions. Atrophic changes  Cervix:  Absent  Uterus:  Absent  Adnexa/parametria:     Rt: Normal in size, without masses or tenderness.   Lt: Normal in size, without masses or tenderness.  Anus and perineum: Normal  Digital rectal exam: Deferred  Patient informed chaperone available to be present for breast and pelvic exam. Patient has requested no chaperone to be present. Patient has been advised what will be completed during breast and pelvic exam.   Assessment/Plan:  72 y.o. G 2P2002 for breast and pelvic exam.   Well female exam with routine gynecological exam - Education provided on SBEs, importance of preventative screenings, current guidelines, high calcium diet, regular exercise, and multivitamin daily. Labs with PCP, endocrinology.   Postmenopausal - No HRT. S/P TAH BSO  Screening for cervical cancer - Normal Pap history.  No longer screening per guidelines.   Screening for breast cancer - Normal mammogram history.  Continue annual screenings.  Normal breast exam today.   Screening for colon cancer - Colonoscopy last week. Will repeat at 5-year interval per GI's recommendation.  Screening for osteoporosis - Normal DXA 01/2022. Will repeat at 5-year interval per recommendation.   Return in 1 year for breast and pelvic exam.     Olivia Mackie DNP, 8:42 AM 12/09/2022

## 2022-12-21 IMAGING — CT CT HEAD W/O CM
3 series · 14 of 45 positions shown, 16 images · non-contrast
Comparison: None.

CLINICAL DATA: Headache, new or worsening (Age >= 50y)



[Series 2: head wo · axial · 0.47mm/px · z∈[-145,-30]mm · 8 of 28 slices shown, 10 images]
[im 3/28  brain]
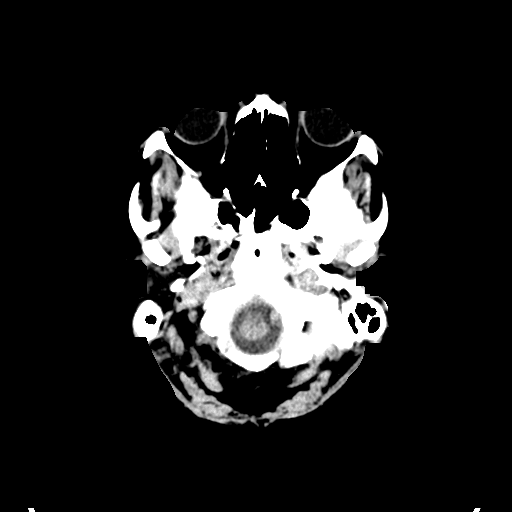
[im 3/28  bone]
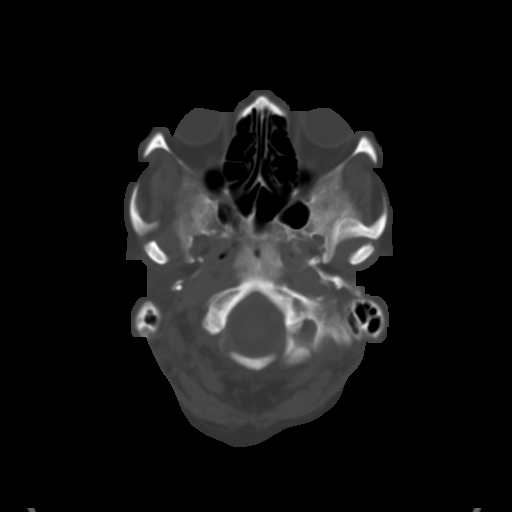
[im 6/28  brain]
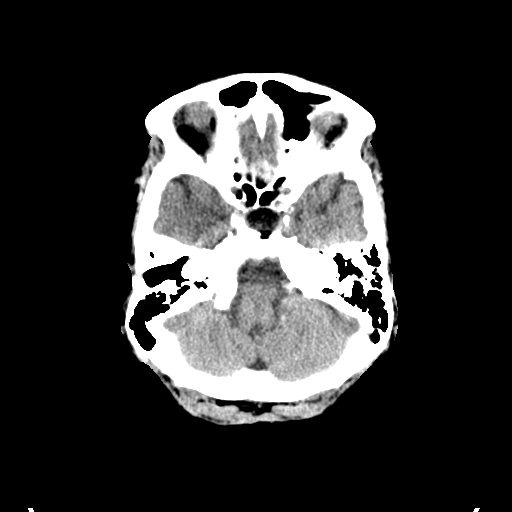
[im 10/28  brain]
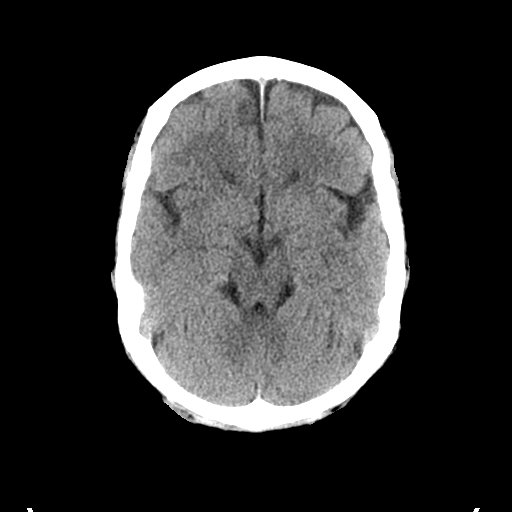
[im 13/28  brain]
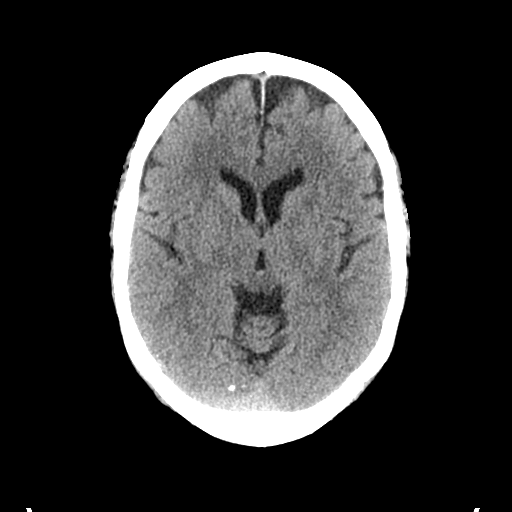
[im 16/28  brain]
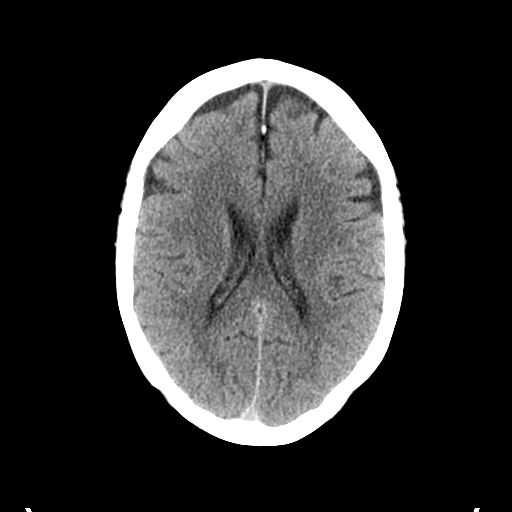
[im 16/28  bone]
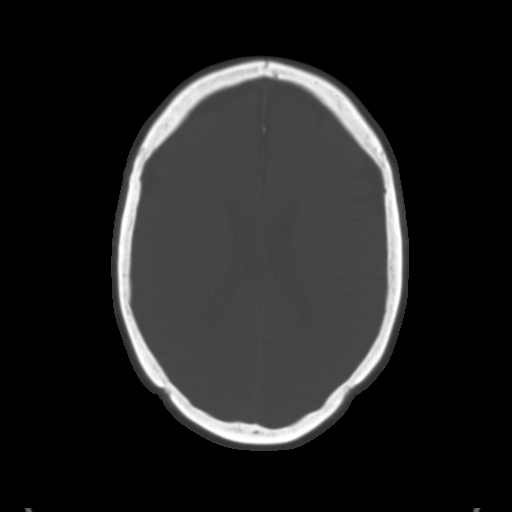
[im 19/28  brain]
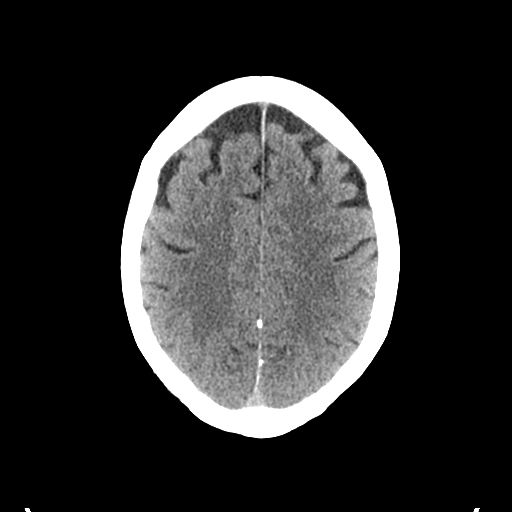
[im 23/28  brain]
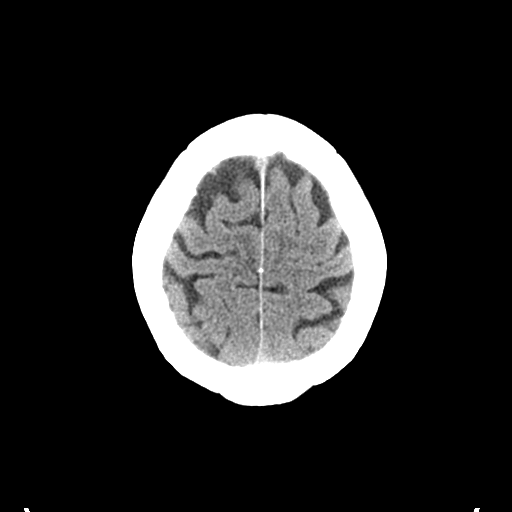
[im 26/28  brain]
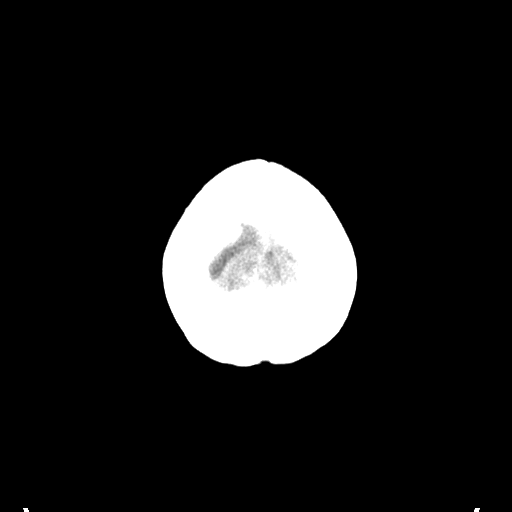

[Series 5: coronal soft tissue · coronal · 0.29mm/px · 3 of 72 slices shown]
[im 24/72  brain]
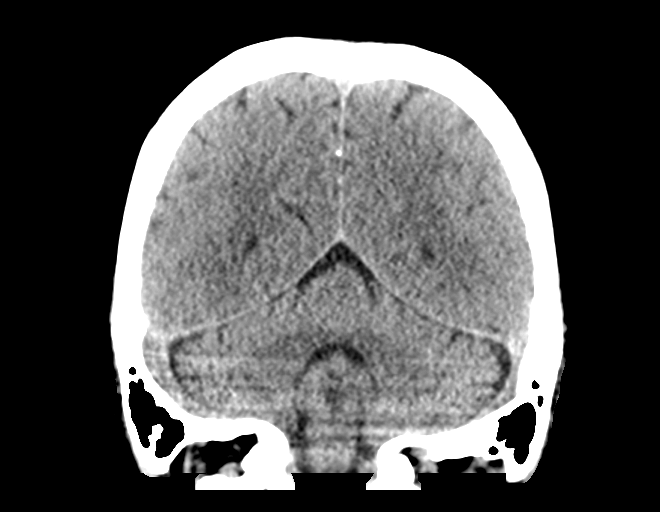
[im 32/72  brain]
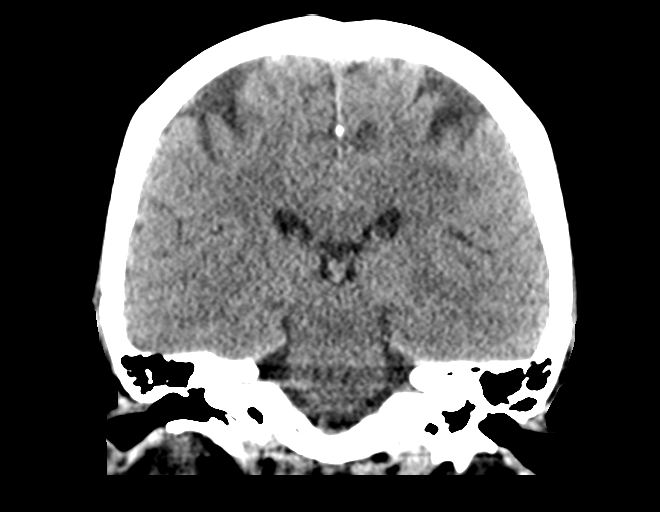
[im 40/72  brain]
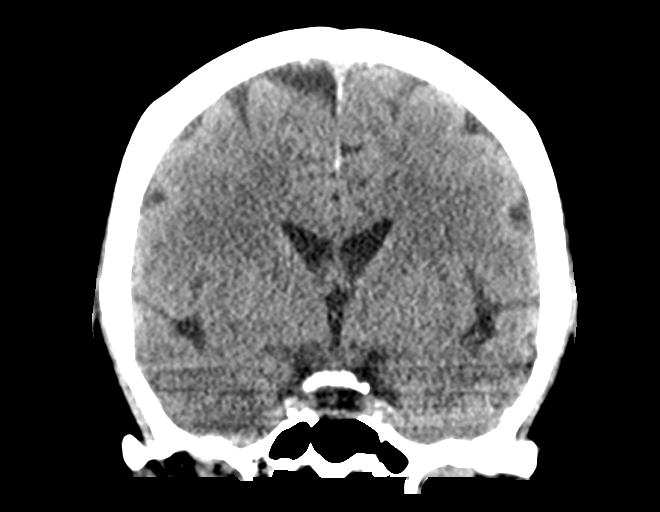

[Series 6: sagittal soft tissue · sagittal · 0.29mm/px · 3 of 54 slices shown]
[im 18/54  brain]
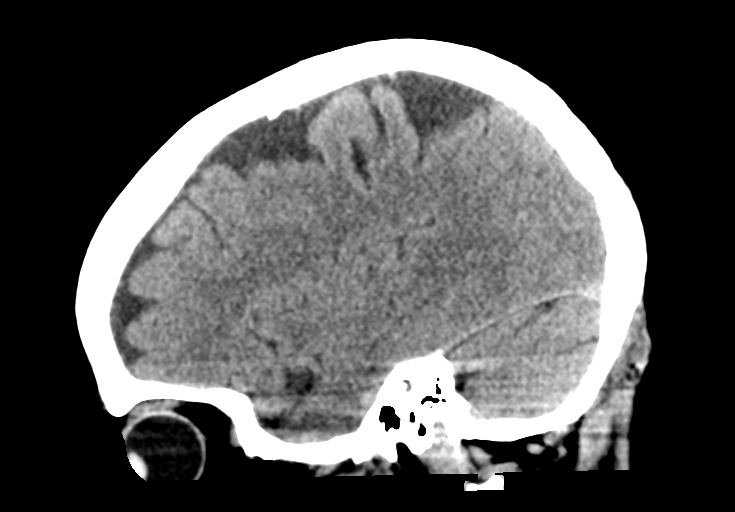
[im 27/54  brain]
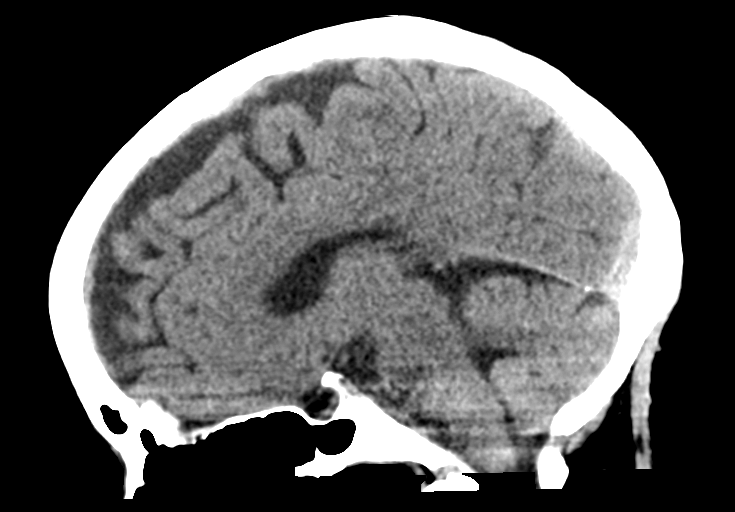
[im 36/54  brain]
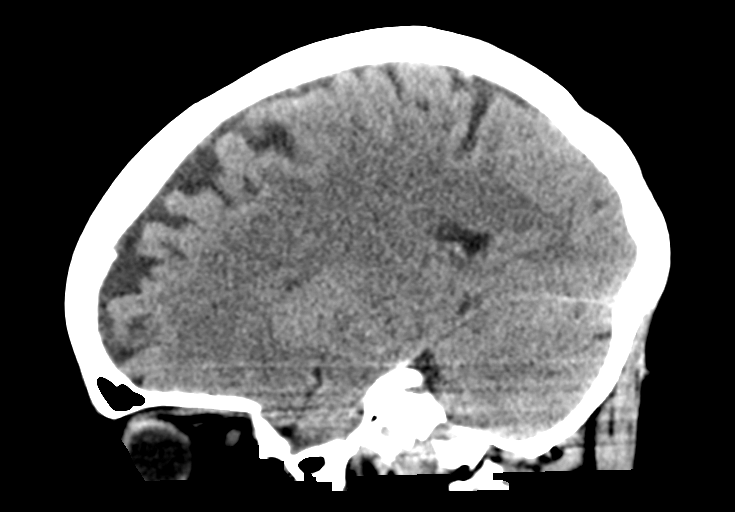

[14 of 45 positions shown; findings below may reference images not displayed]

FINDINGS: Brain: There is no acute intracranial hemorrhage, mass effect, or
edema. Gray-white differentiation is preserved. There is no
extra-axial fluid collection. Ventricles and sulci are within normal
limits in size and configuration.

Vascular: There is atherosclerotic calcification at the skull base.

Skull: Calvarium is unremarkable.

Sinuses/Orbits: No acute finding.

Other: None.
IMPRESSION: No acute intracranial abnormality.

## 2022-12-23 ENCOUNTER — Encounter: Payer: Self-pay | Admitting: Nurse Practitioner

## 2023-03-04 ENCOUNTER — Other Ambulatory Visit: Payer: Self-pay | Admitting: Physician Assistant

## 2023-04-01 ENCOUNTER — Ambulatory Visit
Admission: RE | Admit: 2023-04-01 | Discharge: 2023-04-01 | Disposition: A | Discharge status: Home / Self Care | Payer: Medicare Other | Source: Ambulatory Visit | Attending: Family Medicine | Admitting: Family Medicine

## 2023-04-01 ENCOUNTER — Other Ambulatory Visit: Payer: Self-pay | Admitting: Family Medicine

## 2023-04-01 DIAGNOSIS — M25552 Pain in left hip: Secondary | ICD-10-CM

## 2023-05-15 ENCOUNTER — Ambulatory Visit: Payer: Medicare Other | Attending: Cardiology | Admitting: Cardiology

## 2023-05-15 ENCOUNTER — Encounter: Payer: Self-pay | Admitting: Cardiology

## 2023-05-15 VITALS — BP 128/70 | HR 54 | Ht 63.0 in | Wt 140.8 lb

## 2023-05-15 DIAGNOSIS — I700 Atherosclerosis of aorta: Secondary | ICD-10-CM

## 2023-05-15 DIAGNOSIS — I10 Essential (primary) hypertension: Secondary | ICD-10-CM

## 2023-05-15 DIAGNOSIS — I493 Ventricular premature depolarization: Secondary | ICD-10-CM | POA: Diagnosis not present

## 2023-05-15 DIAGNOSIS — I491 Atrial premature depolarization: Secondary | ICD-10-CM

## 2023-05-15 MED ORDER — METOPROLOL SUCCINATE ER 50 MG PO TB24: 25.0000 mg | ORAL_TABLET | Freq: Every day | ORAL | 3 refills | Status: DC

## 2023-05-15 MED ORDER — ROSUVASTATIN CALCIUM 40 MG PO TABS: 40.0000 mg | ORAL_TABLET | Freq: Every day | ORAL | 3 refills | Status: DC

## 2023-05-15 NOTE — Patient Instructions (Signed)

## 2023-05-15 NOTE — Progress Notes (Signed)
Cardiology Office Note:  .   Date:  05/15/2023  ID:  Candace Baker, DOB July 15, 1950, MRN [id number] PCP: Daisy Floro, MD  [facility] Providers Cardiologist:  Donato Schultz, MD     History of Present Illness: .   Candace Baker is a 72 y.o. female Discussed with the use of AI scribe  History of Present Illness   The 72 year old patient with a history of hypertension, hyperlipidemia, diabetes, palpitations, PVCs, PACs, and aortic atherosclerosis with coronary artery calcification was seen for a follow-up visit. The patient's most recent cardiac monitor showed occasional PVCs and PACs. An echocardiogram performed in 2020 revealed an ejection fraction of 65% with a trivial pericardial effusion.  The patient denied experiencing any chest pain or shortness of breath. They also reported no significant palpitations or feelings of heart skipping. The patient has been active, engaging in activities such as bowling.  Previously, the patient had experienced episodes of dizziness and near fainting. However, cardiac monitoring did not reveal any concerning findings. The patient has been on rosuvastatin, which was increased to further lower their LDL, now at 66.  The patient also reported taking clonidine for blood pressure control. The patient denied any new symptoms or concerns.      Bowling    Studies Reviewed: Marland Kitchen   EKG Interpretation Date/Time:  Thursday May 15 2023 09:22:58 EST Ventricular Rate:  56 PR Interval:  188 QRS Duration:  74 QT Interval:  436 QTC Calculation: 420 R Axis:   38  Text Interpretation: Sinus bradycardia Non-specific ST-t changes When compared with ECG of 31-Dec-2021 20:40, PREVIOUS ECG IS PRESENT Confirmed by Donato Schultz ([id number]) on 05/15/2023 9:41:14 AM    Results LABS LDL: 66 (04/08/2023) HbA1c: 7.3 Creatinine: 1.44  RADIOLOGY Coronary calcium score: 167, 80th percentile (01/02/2022)  DIAGNOSTIC Holter monitor: occasional PVCs and PACs  (11/16/2018) Echocardiogram: ejection fraction 65%, trivial pericardial effusion (2020) EKG: Normal  Risk Assessment/Calculations:            Physical Exam:   VS:  BP 128/70   Pulse (!) 54   Ht 5\' 3"  (1.6 m)   Wt 140 lb 12.8 oz (63.9 kg)   SpO2 96%   BMI 24.94 kg/m    Wt Readings from Last 3 Encounters:  05/15/23 140 lb 12.8 oz (63.9 kg)  12/09/22 140 lb (63.5 kg)  03/07/22 144 lb (65.3 kg)    GEN: Well nourished, well developed in no acute distress NECK: No JVD; No carotid bruits CARDIAC: RRR, no murmurs, no rubs, no gallops RESPIRATORY:  Clear to auscultation without rales, wheezing or rhonchi  ABDOMEN: Soft, non-tender, non-distended EXTREMITIES:  No edema; No deformity   ASSESSMENT AND PLAN: .    Assessment and Plan    Coronary Artery Disease Stable with coronary artery calcification seen on coronary calcium score. LDL at goal (66) on Rosuvastatin. -Continue current regimen of diet, exercise, and Rosuvastatin.  Hypertension Well controlled on Metoprolol 25mg  daily, Telmisartan 80mg  daily, and Clonidine BID. -Continue current antihypertensive regimen.  Diabetes Mellitus Hemoglobin A1c 7.3, indicating fair glycemic control. -Continue current management with diet and exercise.  Premature Ventricular Contractions (PVCs) and Premature Atrial Contractions (PACs) Occasional PVCs and PACs noted on monitor, no significant symptoms reported. -Continue current management and monitoring.  Aortic Atherosclerosis Stable, no new symptoms reported. -Continue current management with diet, exercise, and Rosuvastatin.  General Health Maintenance / Followup Plans -Continue active lifestyle, including bowling. -Maintain healthy diet, avoiding processed foods. -Follow up in 1 year.  Signed, Donato Schultz, MD

## 2023-05-23 ENCOUNTER — Other Ambulatory Visit: Payer: Self-pay | Admitting: Nephrology

## 2023-05-23 DIAGNOSIS — N1832 Chronic kidney disease, stage 3b: Secondary | ICD-10-CM

## 2023-05-26 ENCOUNTER — Ambulatory Visit
Admission: RE | Admit: 2023-05-26 | Discharge: 2023-05-26 | Disposition: A | Discharge status: Home / Self Care | Payer: Medicare Other | Source: Ambulatory Visit | Attending: Nephrology | Admitting: Nephrology

## 2023-05-26 DIAGNOSIS — N1832 Chronic kidney disease, stage 3b: Secondary | ICD-10-CM

## 2024-01-27 ENCOUNTER — Encounter: Payer: Self-pay | Admitting: Nurse Practitioner

## 2024-01-27 ENCOUNTER — Ambulatory Visit (INDEPENDENT_AMBULATORY_CARE_PROVIDER_SITE_OTHER): Admitting: Nurse Practitioner

## 2024-01-27 VITALS — BP 124/74 | HR 82 | Ht 63.5 in | Wt 138.0 lb

## 2024-01-27 DIAGNOSIS — Z780 Asymptomatic menopausal state: Secondary | ICD-10-CM | POA: Diagnosis not present

## 2024-01-27 DIAGNOSIS — Z9189 Other specified personal risk factors, not elsewhere classified: Secondary | ICD-10-CM | POA: Diagnosis not present

## 2024-01-27 DIAGNOSIS — B009 Herpesviral infection, unspecified: Secondary | ICD-10-CM

## 2024-01-27 DIAGNOSIS — Z01419 Encounter for gynecological examination (general) (routine) without abnormal findings: Secondary | ICD-10-CM

## 2024-01-27 NOTE — Patient Instructions (Signed)

## 2024-01-27 NOTE — Progress Notes (Signed)
 Candace Baker September 06, 1950 [id number]   History:  73 y.o. G2P2002 presents for breast and pelvic exam. Postmenopausal - no HRT. S/P 1989 TAH for endometriosis, subsequent BSO 1991. Normal pap and mammogram history. HTN managed by cardiology, DM managed by endocrinology. HSV on buttock, has not had outbreak in years. Acyclovir  as needed.   Gynecologic History No LMP recorded. Patient has had a hysterectomy.   Contraception: status post hysterectomy Sexually active: Yes  Health Maintenance Last Pap: 12/18/2010. Results were: Normal Last mammogram: 12/2023. Results were: Normal Last colonoscopy: 2024. Results were: Normal, 5-year recall Last Dexa: 01/15/2022. Results were: Normal  Past medical history, past surgical history, family history and social history were all reviewed and documented in the EPIC chart. Boyfriend. Loves to travel. Retired. 2 daughters. Daughter with history of breast cancer at age 37.   ROS:  A ROS was performed and pertinent positives and negatives are included.  Exam:  Vitals:   01/27/24 1453  BP: 124/74  Pulse: 82  SpO2: 96%  Weight: 138 lb (62.6 kg)  Height: 5' 3.5 (1.613 m)      Body mass index is 24.06 kg/m.  General appearance:  Normal Thyroid :  Symmetrical, normal in size, without palpable masses or nodularity. Respiratory  Auscultation:  Clear without wheezing or rhonchi Cardiovascular  Auscultation:  Regular rate, without rubs, murmurs or gallops  Edema/varicosities:  Not grossly evident Abdominal  Soft,nontender, without masses, guarding or rebound.  Liver/spleen:  No organomegaly noted  Hernia:  None appreciated  Skin  Inspection:  Grossly normal Breasts: Examined lying and sitting.   Right: Without masses, retractions, nipple discharge or axillary adenopathy.   Left: Without masses, retractions, nipple discharge or axillary adenopathy. Pelvic: External genitalia:  no lesions              Urethra:  normal appearing urethra with  no masses, tenderness or lesions              Bartholins and Skenes: normal                 Vagina: normal appearing vagina with normal color and discharge, no lesions. Atrophic changes              Cervix: absent Bimanual Exam:  Uterus:  absent              Adnexa: no mass, fullness, tenderness              Rectovaginal: Deferred              Anus:  normal, no lesions  Patient informed chaperone available to be present for breast and pelvic exam. Patient has requested no chaperone to be present. Patient has been advised what will be completed during breast and pelvic exam.   Assessment/Plan:  73 y.o. G 2P2002 for breast and pelvic exam.   Encounter for breast and pelvic examination - Education provided on SBEs, importance of preventative screenings, current guidelines, high calcium  diet, regular exercise, and multivitamin daily. Labs with PCP, endocrinology.   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 Has not had outbreak in years.   Postmenopausal - No HRT. S/P TAH BSO  Screening for cervical cancer - Normal Pap history.  No longer screening per guidelines.   Screening for breast cancer - Normal mammogram history.  Continue annual screenings.  Normal breast exam today.   Screening for colon cancer - 2024 colonoscopy.   Screening for osteoporosis - Normal DXA 01/2022. Will repeat  at 5-year interval per recommendation.   Return in about 1 year (around 01/26/2025) for B&P (high risk).    [name] DNP, 3:20 PM 01/27/2024

## 2024-02-06 ENCOUNTER — Encounter (HOSPITAL_COMMUNITY): Payer: Self-pay | Admitting: Nephrology

## 2024-02-06 ENCOUNTER — Other Ambulatory Visit (HOSPITAL_COMMUNITY): Payer: Self-pay | Admitting: Nephrology

## 2024-02-06 DIAGNOSIS — R809 Proteinuria, unspecified: Secondary | ICD-10-CM

## 2024-02-24 ENCOUNTER — Other Ambulatory Visit: Payer: Self-pay | Admitting: Radiology

## 2024-02-24 DIAGNOSIS — R801 Persistent proteinuria, unspecified: Secondary | ICD-10-CM

## 2024-02-24 NOTE — H&P (Signed)
 Chief Complaint: Proteinuria. Request is for random renal biopsy for further evaluation   Referring Physician(s): Singh,Vikas  Supervising Physician: Vanice Revel  Patient Status: [facility] - Out-pt  History of Present Illness: Candace Baker is a 73 y.o. female outpatient. History of HTN, DM, HLD, CKD stage III. Found to have proteinuria. Team is requesting a random renal biopsy for further evaluation.   Family at bedside. Currently without any significant complaints. Patient alert and laying in bed,calm. Denies any fevers, headache, chest pain, SOB, cough, abdominal pain, nausea, vomiting or bleeding.   US  renal fated 11.28.24 reads Increased echogenicity of the bilateral renal cortices with multiple cysts, consistent with medical renal disease. All meds are within acceptable parameters. Patient is on 81 mg of ASA. Last dose given on 8.15.25 No pertinent allergies. Patient has been NPO since midnight.   Return precautions and treatment recommendations and follow-up discussed with the patient and her family at bedside. Both who are agreeable with the plan.    Past Medical History:  Diagnosis Date   Diabetes mellitus    Elevated cholesterol    Endometriosis    Gallstones    Hypertension    STD (sexually transmitted disease)    HSV    Past Surgical History:  Procedure Laterality Date   ABDOMINAL HYSTERECTOMY  1989   TAH    CHOLECYSTECTOMY     OOPHORECTOMY  1991   BSO   TONSILLECTOMY      Allergies: Atorvastatin, Diltiazem hcl, and Glimepiride  Medications: Prior to Admission medications   Medication Sig Start Date End Date Taking? Authorizing Provider  acyclovir  (ZOVIRAX ) 400 MG tablet TAKE ONE TABLET BY MOUTH TWICE DAILY FOR 3-5 DAYS THEN DAILY AS NEEDED. 09/17/22   Prentiss Riggs A, NP  aspirin  81 MG tablet Take 81 mg by mouth.    [provider]  cloNIDine  (CATAPRES ) 0.1 MG tablet Take 1 tablet by mouth in the morning and at bedtime. 12/13/21    [provider]  Finerenone (KERENDIA) 10 MG TABS     [provider]  glipiZIDE (GLUCOTROL XL) 5 MG 24 hr tablet Take 5 mg by mouth daily. 09/21/18   [provider]  JARDIANCE 25 MG TABS tablet Take 25 mg by mouth daily. 12/08/21   [provider]  metFORMIN (GLUCOPHAGE-XR) 500 MG 24 hr tablet Take 500 mg by mouth 2 (two) times daily. 01/27/22   [provider]  metoprolol  succinate (TOPROL -XL) 50 MG 24 hr tablet Take 0.5 tablets (25 mg total) by mouth daily. 05/15/23   Jeffrie Oneil BROCKS, MD  Multiple Vitamins-Minerals (WOMENS 50+ ADVANCED) CAPS See admin instructions.    [provider]  rosuvastatin  (CRESTOR ) 40 MG tablet Take 1 tablet (40 mg total) by mouth daily. 05/15/23   Jeffrie Oneil BROCKS, MD  telmisartan (MICARDIS) 80 MG tablet Take 80 mg by mouth.    [provider]     Family History  Problem Relation Age of Onset   Sarcoidosis Mother    Hypertension Maternal Grandfather    Heart disease Paternal Grandfather        NOT INDICATED IF PATERNAL OR MATERNA GF    Social History   Socioeconomic History   Marital status: Divorced    Spouse name: Not on file   Number of children: Not on file   Years of education: Not on file   Highest education level: Not on file  Occupational History   Not on file  Tobacco Use   Smoking status:  Never   Smokeless tobacco: Never  Vaping Use   Vaping status: Never Used  Substance and Sexual Activity   Alcohol use: Yes    Alcohol/week: 0.0 standard drinks of alcohol    Comment: Rare   Drug use: Never   Sexual activity: Yes    Birth control/protection: Surgical    Comment: 1st intercourse 73 yo-More than 5 partners  Other Topics Concern   Not on file  Social History Narrative   Not on file   Social Drivers of Health   Financial Resource Strain: Not on file  Food Insecurity: Not on file  Transportation Needs: Not on file  Physical Activity: Not on file  Stress: Not on file  Social  Connections: Unknown (11/18/2021)   [facility]   Social Network    Social Network: Not on file      Review of Systems: A 12 point ROS discussed and pertinent positives are indicated in the HPI above.  All other systems are negative.  Review of Systems  Constitutional:  Negative for fatigue and fever.  HENT:  Negative for congestion.   Respiratory:  Negative for cough and shortness of breath.   Gastrointestinal:  Negative for abdominal pain, diarrhea, nausea and vomiting.    Vital Signs: BP (!) 162/74   Pulse (!) 55   Temp 97.9 F (36.6 C) (Oral)   Resp 16   Ht 5' 3.5 (1.613 m)   Wt 139 lb (63 kg)   SpO2 99%   BMI 24.24 kg/m   Advance Care Plan: The advanced care plan/surrogate decision maker was discussed at the time of visit and the patient did not wish to discuss or was not able to name a surrogate decision maker or provide an advance care plan.    Physical Exam Vitals and nursing note reviewed.  Constitutional:      Appearance: She is well-developed.  HENT:     Head: Normocephalic and atraumatic.     Mouth/Throat:     Mouth: Mucous membranes are dry.  Eyes:     Conjunctiva/sclera: Conjunctivae normal.  Cardiovascular:     Rate and Rhythm: Normal rate and regular rhythm.  Pulmonary:     Effort: Pulmonary effort is normal.  Musculoskeletal:        General: Normal range of motion.     Cervical back: Normal range of motion.  Skin:    General: Skin is warm and dry.  Neurological:     General: No focal deficit present.     Mental Status: She is alert and oriented to person, place, and time. Mental status is at baseline.  Psychiatric:        Mood and Affect: Mood normal.        Behavior: Behavior normal.        Thought Content: Thought content normal.        Judgment: Judgment normal.     Imaging: No results found.  Labs:  CBC: Recent Labs    02/25/24 0620  WBC 7.7  HGB 11.6*  HCT 36.4  PLT 221    COAGS: Recent Labs     02/25/24 0620  INR 0.9    BMP: No results for input(s): NA, K, CL, CO2, GLUCOSE, BUN, CALCIUM , CREATININE, GFRNONAA, GFRAA in the last 8760 hours.  Invalid input(s): CMP  LIVER FUNCTION TESTS: No results for input(s): BILITOT, AST, ALT, ALKPHOS, PROT, ALBUMIN in the last 8760 hours.  TUMOR MARKERS: No results for input(s): AFPTM, CEA, CA199, CHROMGRNA in  the last 8760 hours.  Assessment and Plan:  73 y.o. Female outpatient. History of HTN, DM, HLD, CKD stage III. Found to have proteinuria. Team is requesting a random renal biopsy for further evaluation.   PLAN: IR Image Guided Random Renal Biopsy   Risks and benefits of random renal biopsy was discussed with the patient and/or patient's family including, but not limited to bleeding, infection, damage to adjacent structures or low yield requiring additional tests.  All of the questions were answered and there is agreement to proceed.  Consent signed and in chart.   Thank you for this interesting consult.  I greatly enjoyed meeting Candace Baker and look forward to participating in their care.  A copy of this report was sent to the requesting provider on this date.  Electronically Signed: Delon JAYSON Beagle, NP 02/25/2024, 7:43 AM   I spent a total of  30 Minutes in face to face in clinical consultation, greater than 50% of which was counseling/coordinating care for random renal biopsy

## 2024-02-25 ENCOUNTER — Ambulatory Visit (HOSPITAL_COMMUNITY)
Admission: RE | Admit: 2024-02-25 | Discharge: 2024-02-25 | Disposition: A | Discharge status: Home / Self Care | Source: Ambulatory Visit | Attending: Nephrology | Admitting: Nephrology

## 2024-02-25 ENCOUNTER — Other Ambulatory Visit: Payer: Self-pay

## 2024-02-25 DIAGNOSIS — R809 Proteinuria, unspecified: Secondary | ICD-10-CM | POA: Diagnosis present

## 2024-02-25 DIAGNOSIS — E1122 Type 2 diabetes mellitus with diabetic chronic kidney disease: Secondary | ICD-10-CM | POA: Diagnosis not present

## 2024-02-25 DIAGNOSIS — I129 Hypertensive chronic kidney disease with stage 1 through stage 4 chronic kidney disease, or unspecified chronic kidney disease: Secondary | ICD-10-CM | POA: Insufficient documentation

## 2024-02-25 DIAGNOSIS — Z7984 Long term (current) use of oral hypoglycemic drugs: Secondary | ICD-10-CM | POA: Insufficient documentation

## 2024-02-25 DIAGNOSIS — E785 Hyperlipidemia, unspecified: Secondary | ICD-10-CM | POA: Diagnosis not present

## 2024-02-25 DIAGNOSIS — N183 Chronic kidney disease, stage 3 unspecified: Secondary | ICD-10-CM | POA: Diagnosis not present

## 2024-02-25 DIAGNOSIS — R801 Persistent proteinuria, unspecified: Secondary | ICD-10-CM

## 2024-02-25 LAB — CBC
HCT: 36.4 % (ref 36.0–46.0)
Hemoglobin: 11.6 g/dL — ABNORMAL LOW (ref 12.0–15.0)
MCH: 29.5 pg (ref 26.0–34.0)
MCHC: 31.9 g/dL (ref 30.0–36.0)
MCV: 92.6 fL (ref 80.0–100.0)
Platelets: 221 K/uL (ref 150–400)
RBC: 3.93 MIL/uL (ref 3.87–5.11)
RDW: 13.2 % (ref 11.5–15.5)
WBC: 7.7 K/uL (ref 4.0–10.5)
nRBC: 0 % (ref 0.0–0.2)

## 2024-02-25 LAB — GLUCOSE, CAPILLARY
Glucose-Capillary: 122 mg/dL — ABNORMAL HIGH (ref 70–99)
Glucose-Capillary: 126 mg/dL — ABNORMAL HIGH (ref 70–99)

## 2024-02-25 LAB — PROTIME-INR
INR: 0.9 (ref 0.8–1.2)
Prothrombin Time: 12.5 s (ref 11.4–15.2)

## 2024-02-25 MED ORDER — MIDAZOLAM HCL 2 MG/2ML IJ SOLN
INTRAMUSCULAR | Status: AC
  Filled 2024-02-25: qty 2

## 2024-02-25 MED ORDER — CLONIDINE HCL 0.1 MG PO TABS
0.1000 mg | ORAL_TABLET | Freq: Once | ORAL | Status: AC
  Administered 2024-02-25: 0.1 mg via ORAL
  Filled 2024-02-25: qty 1

## 2024-02-25 MED ORDER — GELATIN ABSORBABLE 12-7 MM EX MISC
1.0000 | Freq: Once | CUTANEOUS | Status: AC
  Administered 2024-02-25: 1 via TOPICAL

## 2024-02-25 MED ORDER — SODIUM CHLORIDE 0.9 % IV SOLN: INTRAVENOUS | Status: DC

## 2024-02-25 MED ORDER — HYDRALAZINE HCL 20 MG/ML IJ SOLN
10.0000 mg | Freq: Once | INTRAMUSCULAR | Status: AC
  Administered 2024-02-25: 10 mg via INTRAVENOUS

## 2024-02-25 MED ORDER — FENTANYL CITRATE (PF) 100 MCG/2ML IJ SOLN
INTRAMUSCULAR | Status: AC
  Filled 2024-02-25: qty 2

## 2024-02-25 MED ORDER — FENTANYL CITRATE (PF) 100 MCG/2ML IJ SOLN
INTRAMUSCULAR | Status: AC | PRN
  Administered 2024-02-25 (×2): 25 ug via INTRAVENOUS

## 2024-02-25 MED ORDER — LIDOCAINE HCL (PF) 1 % IJ SOLN
6.0000 mL | Freq: Once | INTRAMUSCULAR | Status: AC
  Administered 2024-02-25: 6 mL via INTRADERMAL

## 2024-02-25 MED ORDER — HYDRALAZINE HCL 20 MG/ML IJ SOLN
INTRAMUSCULAR | Status: AC
  Filled 2024-02-25: qty 1

## 2024-02-25 MED ORDER — MIDAZOLAM HCL 2 MG/2ML IJ SOLN
INTRAMUSCULAR | Status: AC | PRN
  Administered 2024-02-25: .5 mg via INTRAVENOUS
  Administered 2024-02-25: 1 mg via INTRAVENOUS

## 2024-02-25 NOTE — Procedures (Signed)
 Interventional Radiology Procedure Note  Procedure: US  CORE RENAL CORTEX BX (LEFT)    Complications: None  Estimated Blood Loss:  MIN  Findings: 18G 1CM CORE X 2     M. FREDERIC SPECKING, MD

## 2024-03-02 ENCOUNTER — Encounter (HOSPITAL_COMMUNITY): Payer: Self-pay

## 2024-03-02 LAB — SURGICAL PATHOLOGY

## 2024-06-16 ENCOUNTER — Other Ambulatory Visit: Payer: Self-pay | Admitting: Cardiology

## 2024-07-21 ENCOUNTER — Other Ambulatory Visit: Payer: Self-pay | Admitting: Cardiology

## 2024-07-22 ENCOUNTER — Other Ambulatory Visit: Payer: Self-pay | Admitting: Cardiology

## 2024-07-22 NOTE — Telephone Encounter (Signed)
 In accordance with refill protocols, please review and address the following requirements before this medication refill can be authorized:  Labs

## 2024-08-04 ENCOUNTER — Ambulatory Visit: Admitting: Cardiology

## 2024-08-04 VITALS — BP 118/70 | HR 54 | Ht 64.0 in | Wt 142.0 lb

## 2024-08-04 DIAGNOSIS — I700 Atherosclerosis of aorta: Secondary | ICD-10-CM

## 2024-08-04 DIAGNOSIS — I10 Essential (primary) hypertension: Secondary | ICD-10-CM | POA: Diagnosis not present

## 2024-08-04 DIAGNOSIS — N1831 Chronic kidney disease, stage 3a: Secondary | ICD-10-CM | POA: Diagnosis not present

## 2024-08-04 DIAGNOSIS — I493 Ventricular premature depolarization: Secondary | ICD-10-CM | POA: Diagnosis not present

## 2024-08-04 DIAGNOSIS — E119 Type 2 diabetes mellitus without complications: Secondary | ICD-10-CM | POA: Diagnosis not present

## 2024-08-04 NOTE — Patient Instructions (Signed)

## 2024-08-04 NOTE — Progress Notes (Signed)
 " Cardiology Office Note:  .   Date:  08/04/2024  ID:  Candace Baker, DOB September 24, 1950, MRN [id number] PCP: [facility] Providers Cardiologist:  Oneil Parchment, MD     History of Present Illness: .   Candace Baker is a 75 y.o. female Discussed the use of AI scribe  History of Present Illness Candace Baker is a 74 year old female with hypertension, diabetes, and coronary artery disease who presents for a follow-up visit.  Cardiovascular symptoms and findings - Coronary artery calcification with a coronary calcium  score of 167 as of January 02, 2022 - Echocardiogram in 2020 showed ejection fraction of 65% and trivial pericardial effusion - No chest pain, shortness of breath, or palpitations - No dizziness or lightheadedness despite low blood pressure readings - Recent blood pressure reading of 118/70, monitored at home and transmitted to provider  Hypertension management - Home blood pressure monitoring with monthly review calls - Current antihypertensive regimen includes clonidine  0.1 mg twice daily and telmisartan 80 mg daily - No symptoms of hypotension  Diabetes mellitus management - Current medications include Jardiance 25 mg daily and glipizide 5 mg daily  Chronic kidney disease - Creatinine level of 1.5 - Kerendia 10 mg daily for renal protection  Lipid management - LDL cholesterol was 60 in 7975 - Rosuvastatin  40 mg daily  Hematologic findings - Hemoglobin is 11.6  Antiplatelet therapy - Aspirin  81 mg daily      Studies Reviewed: SABRA   EKG Interpretation Date/Time:  Wednesday August 04 2024 09:39:10 EST Ventricular Rate:  105 PR Interval:  146 QRS Duration:  80 QT Interval:  388 QTC Calculation: 512 R Axis:   25  Text Interpretation: Sinus tachycardia Possible Left atrial enlargement When compared with ECG of 15-May-2023 09:22, Vent. rate has increased BY  49 BPM Minimal criteria for Anterior infarct are  no longer Present ST no longer elevated in Inferior leads Confirmed by Parchment Oneil ([id number]) on 08/04/2024 10:25:40 AM    Results Labs LDL (2025): 60 Creatinine: 1.5 Hemoglobin: 11.6 A1c (02/2024): 7.6  Radiology Coronary calcium  score (01/02/2022): Score 167, 80th percentile  Diagnostic Echocardiogram (2020): Left ventricular ejection fraction 65%, trivial pericardial effusion Risk Assessment/Calculations:            Physical Exam:   VS:  BP 118/70 (BP Location: Left Arm, Patient Position: Sitting, Cuff Size: Normal)   Pulse (!) 54   Ht 5' 4 (1.626 m)   Wt 142 lb (64.4 kg)   BMI 24.37 kg/m    Wt Readings from Last 3 Encounters:  08/04/24 142 lb (64.4 kg)  02/25/24 139 lb (63 kg)  01/27/24 138 lb (62.6 kg)    GEN: Well nourished, well developed in no acute distress NECK: No JVD; No carotid bruits CARDIAC: RRR, no murmurs, no rubs, no gallops RESPIRATORY:  Clear to auscultation without rales, wheezing or rhonchi  ABDOMEN: Soft, non-tender, non-distended EXTREMITIES:  No edema; No deformity   ASSESSMENT AND PLAN: .    Assessment and Plan Assessment & Plan Hypertension Blood pressure is well-controlled with current medication regimen. Home monitoring shows consistent readings, with today's reading at 118/70 mmHg. No symptoms of hypotension reported. - Continue current antihypertensive regimen including clonidine  and telmisartan. - Continue home blood pressure monitoring and report any significant changes.  Type 2 Diabetes Mellitus Diabetes management is ongoing with current medications. Hemoglobin A1c was 7.6% in August, indicating room for improvement. Jardiance is being used for  both diabetes and cardiovascular benefits. - Continue current diabetes medications including Jardiance and glipizide.  Chronic Kidney Disease Stage 3B Chronic kidney disease is well-managed with creatinine at 1.5 mg/dL. Advised to avoid NSAIDs to prevent further kidney damage. - Continue  current management and monitoring of kidney function. - Avoid NSAIDs such as ibuprofen.  Coronary Artery Disease with Coronary Calcification Coronary artery disease with previous coronary calcium  score of 167, placing her in the 80th percentile. Cholesterol levels are well-controlled with current treatment. - Continue current lipid-lowering therapy with rosuvastatin .  Aortic Atherosclerosis Aortic atherosclerosis is being managed with current cardiovascular medications. - Continue current cardiovascular medications.  Premature Ventricular and Atrial Contractions No new symptoms reported. Current management appears effective. - Continue current management.  Hyperlipidemia Lipid levels are well-controlled with LDL at 60 mg/dL. Current treatment regimen is effective. - Continue current lipid-lowering therapy with rosuvastatin .         Dispo: 1 yr  Signed, Oneil Parchment, MD  "
